# Patient Record
Sex: Male | Born: 1950 | Race: Black or African American | Hispanic: No | Marital: Single | Smoking: Former smoker
Health system: Southern US, Community
[De-identification: ages and names within clinical notes are randomized; demographics above are authoritative.]

## PROBLEM LIST (undated history)

## (undated) DIAGNOSIS — R0602 Shortness of breath: Secondary | ICD-10-CM

## (undated) DIAGNOSIS — E785 Hyperlipidemia, unspecified: Secondary | ICD-10-CM

## (undated) DIAGNOSIS — I10 Essential (primary) hypertension: Secondary | ICD-10-CM

## (undated) DIAGNOSIS — C649 Malignant neoplasm of unspecified kidney, except renal pelvis: Secondary | ICD-10-CM

## (undated) DIAGNOSIS — E039 Hypothyroidism, unspecified: Secondary | ICD-10-CM

## (undated) DIAGNOSIS — E860 Dehydration: Principal | ICD-10-CM

## (undated) DIAGNOSIS — I499 Cardiac arrhythmia, unspecified: Secondary | ICD-10-CM

## (undated) DIAGNOSIS — I509 Heart failure, unspecified: Secondary | ICD-10-CM

---

## 2004-03-03 ENCOUNTER — Encounter: Admission: RE | Admit: 2004-03-03 | Discharge: 2004-03-03 | Payer: Self-pay | Admitting: Cardiovascular Disease

## 2005-07-27 IMAGING — CR DG CHEST 2V
2 series · 2 of 2 positions shown · non-contrast
Comparison: none

CLINICAL DATA: Chest pain. 
 CHEST X-RAY: 
 Two views of the chest show the lungs to be clear.  There is mild cardiomegaly present.  No acute bony abnormality is seen.

[view not recorded (1 of 2)]
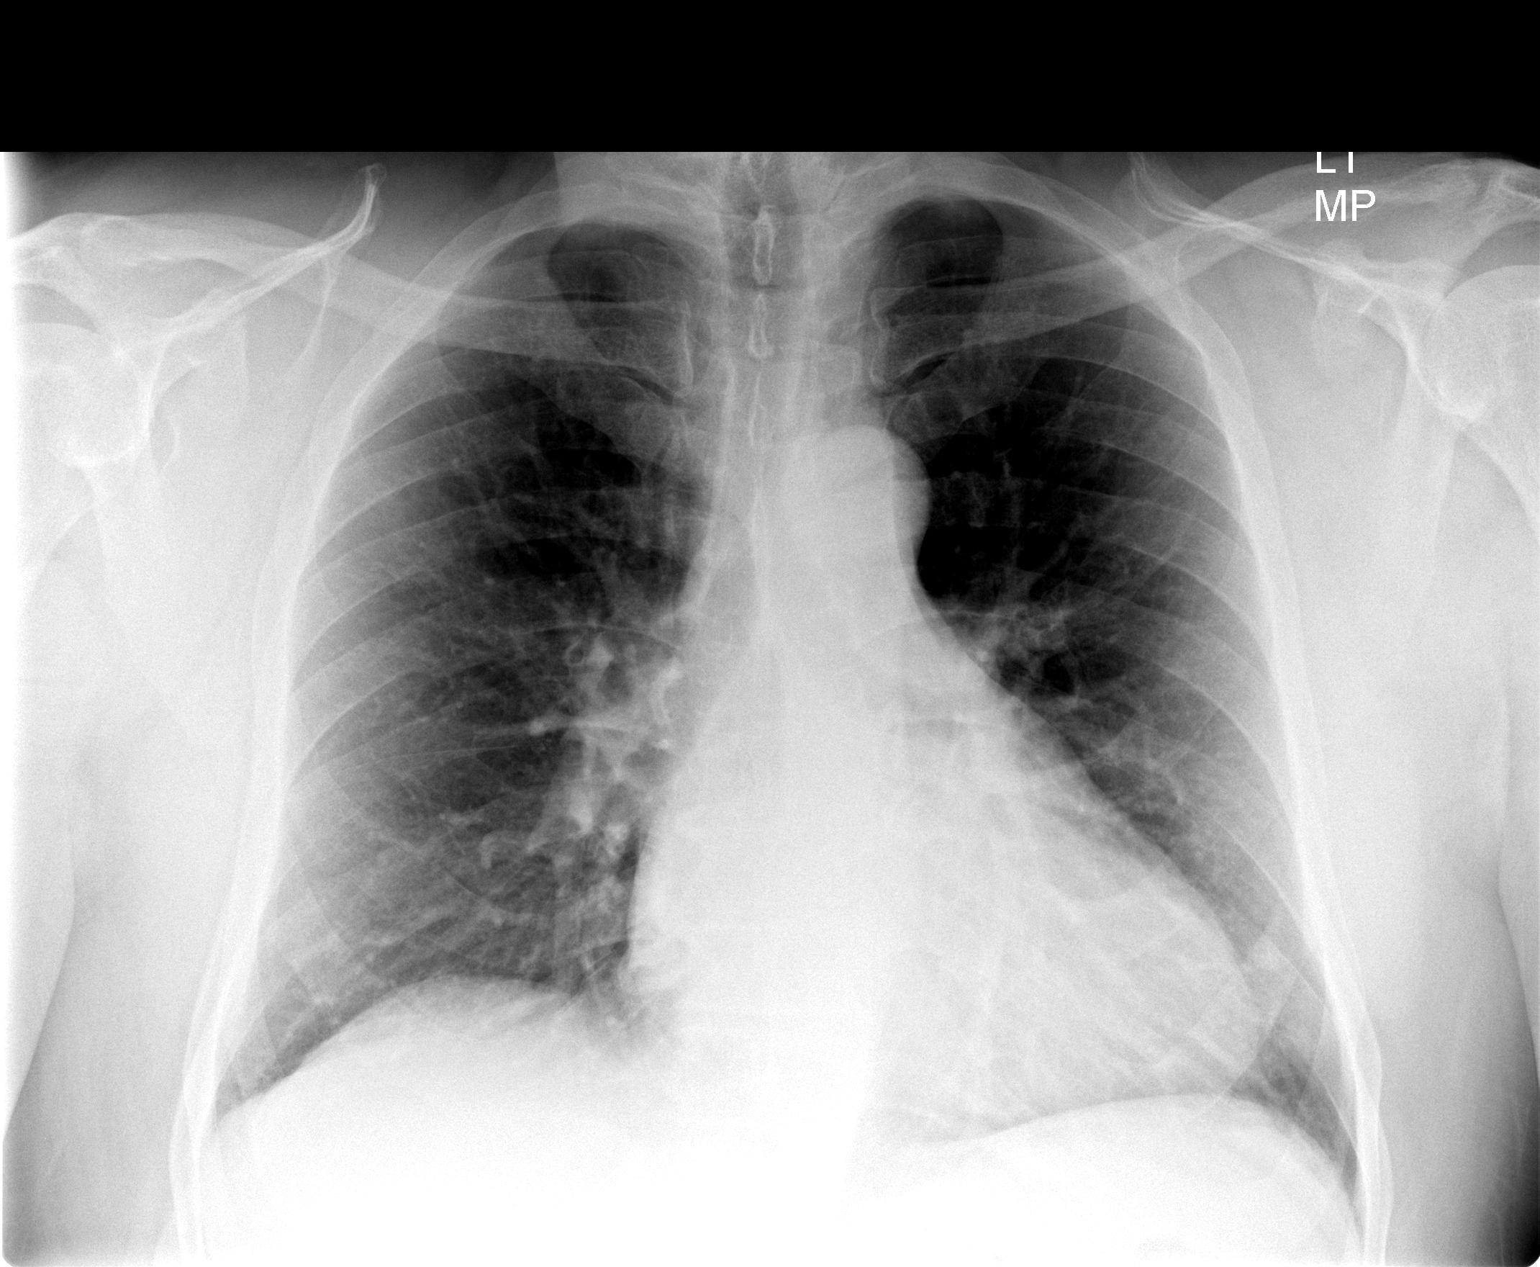

[view not recorded (2 of 2)]
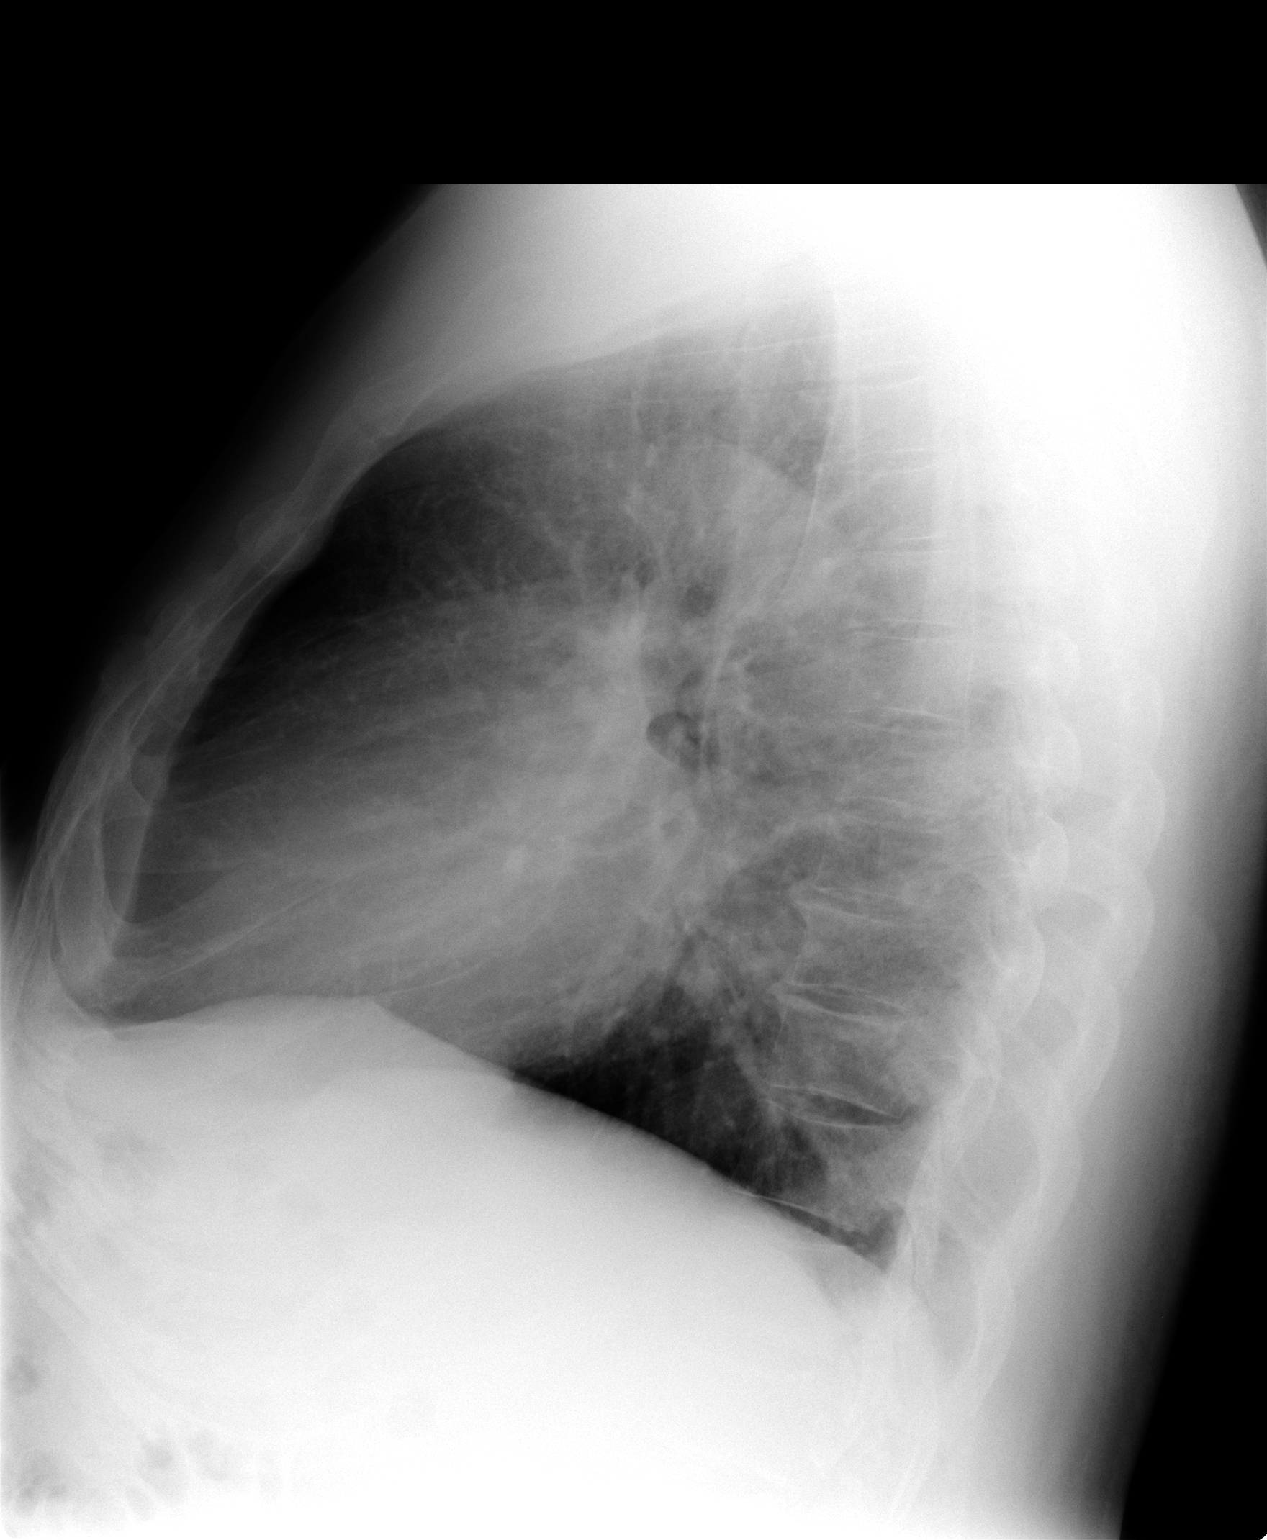

[2 of 2 positions shown; findings below may reference images not displayed]

IMPRESSION: No active lung disease.  Mild cardiomegaly.

## 2012-05-04 ENCOUNTER — Ambulatory Visit (HOSPITAL_BASED_OUTPATIENT_CLINIC_OR_DEPARTMENT_OTHER)
Admission: RE | Admit: 2012-05-04 | Discharge: 2012-05-04 | Disposition: A | Discharge status: Home / Self Care | Payer: Medicare Other | Source: Ambulatory Visit | Attending: Orthopedic Surgery | Admitting: Orthopedic Surgery

## 2012-05-04 ENCOUNTER — Encounter (HOSPITAL_BASED_OUTPATIENT_CLINIC_OR_DEPARTMENT_OTHER)
Admission: RE | Disposition: A | Discharge status: Home / Self Care | Payer: Self-pay | Source: Ambulatory Visit | Attending: Orthopedic Surgery

## 2012-05-04 ENCOUNTER — Encounter (HOSPITAL_BASED_OUTPATIENT_CLINIC_OR_DEPARTMENT_OTHER): Payer: Self-pay

## 2012-05-04 DIAGNOSIS — I10 Essential (primary) hypertension: Secondary | ICD-10-CM | POA: Insufficient documentation

## 2012-05-04 DIAGNOSIS — Z79899 Other long term (current) drug therapy: Secondary | ICD-10-CM | POA: Insufficient documentation

## 2012-05-04 DIAGNOSIS — Z91041 Radiographic dye allergy status: Secondary | ICD-10-CM | POA: Insufficient documentation

## 2012-05-04 DIAGNOSIS — L03019 Cellulitis of unspecified finger: Secondary | ICD-10-CM | POA: Insufficient documentation

## 2012-05-04 DIAGNOSIS — IMO0002 Reserved for concepts with insufficient information to code with codable children: Secondary | ICD-10-CM | POA: Insufficient documentation

## 2012-05-04 HISTORY — PX: IRRIGATION AND DEBRIDEMENT ABSCESS: SHX5252

## 2012-05-04 HISTORY — DX: Essential (primary) hypertension: I10

## 2012-05-04 HISTORY — DX: Hypothyroidism, unspecified: E03.9

## 2012-05-04 SURGERY — MINOR INCISION AND DRAINAGE OF ABSCESS
Anesthesia: LOCAL | Site: Finger | Laterality: Left | Wound class: Dirty or Infected

## 2012-05-04 MED ORDER — SULFAMETHOXAZOLE-TRIMETHOPRIM 800-160 MG PO TABS: 1.0000 | ORAL_TABLET | Freq: Two times a day (BID) | ORAL | Status: DC

## 2012-05-04 MED ORDER — OXYCODONE-ACETAMINOPHEN 5-325 MG PO TABS: ORAL_TABLET | ORAL | Status: AC

## 2012-05-04 MED ORDER — LIDOCAINE HCL 2 % IJ SOLN
INTRAMUSCULAR | Status: DC | PRN
  Administered 2012-05-04: 7 mL

## 2012-05-04 SURGICAL SUPPLY — 38 items
BANDAGE ADHESIVE 1X3 (GAUZE/BANDAGES/DRESSINGS) IMPLANT
BLADE SURG 15 STRL LF DISP TIS (BLADE) ×1 IMPLANT
BLADE SURG 15 STRL SS (BLADE) ×1
BNDG COHESIVE 1X5 TAN STRL LF (GAUZE/BANDAGES/DRESSINGS) IMPLANT
BNDG ELASTIC 2 VLCR STRL LF (GAUZE/BANDAGES/DRESSINGS) IMPLANT
BNDG ESMARK 4X9 LF (GAUZE/BANDAGES/DRESSINGS) IMPLANT
BRUSH SCRUB EZ PLAIN DRY (MISCELLANEOUS) ×2 IMPLANT
CLOTH BEACON ORANGE TIMEOUT ST (SAFETY) ×2 IMPLANT
CORDS BIPOLAR (ELECTRODE) IMPLANT
COVER MAYO STAND STRL (DRAPES) ×2 IMPLANT
CUFF TOURNIQUET SINGLE 18IN (TOURNIQUET CUFF) IMPLANT
DECANTER SPIKE VIAL GLASS SM (MISCELLANEOUS) IMPLANT
DRAIN PENROSE 1/2X12 LTX STRL (WOUND CARE) ×2 IMPLANT
DRAPE SURG 17X23 STRL (DRAPES) ×2 IMPLANT
DRSG EMULSION OIL 3X3 NADH (GAUZE/BANDAGES/DRESSINGS) ×2 IMPLANT
GAUZE SPONGE 4X4 12PLY STRL LF (GAUZE/BANDAGES/DRESSINGS) ×4 IMPLANT
GAUZE XEROFORM 1X8 LF (GAUZE/BANDAGES/DRESSINGS) IMPLANT
GLOVE BIOGEL M STRL SZ7.5 (GLOVE) ×2 IMPLANT
GLOVE ORTHO TXT STRL SZ7.5 (GLOVE) ×2 IMPLANT
GOWN BRE IMP PREV XXLGXLNG (GOWN DISPOSABLE) ×4 IMPLANT
GOWN PREVENTION PLUS XLARGE (GOWN DISPOSABLE) ×2 IMPLANT
NEEDLE 27GAX1X1/2 (NEEDLE) ×2 IMPLANT
PACK BASIN DAY SURGERY FS (CUSTOM PROCEDURE TRAY) ×2 IMPLANT
PADDING CAST ABS 4INX4YD NS (CAST SUPPLIES) ×1
PADDING CAST ABS COTTON 4X4 ST (CAST SUPPLIES) ×1 IMPLANT
SPONGE GAUZE 4X4 12PLY (GAUZE/BANDAGES/DRESSINGS) ×2 IMPLANT
STOCKINETTE 4X48 STRL (DRAPES) ×2 IMPLANT
STRIP CLOSURE SKIN 1/2X4 (GAUZE/BANDAGES/DRESSINGS) IMPLANT
SUT ETHILON 5 0 P 3 18 (SUTURE) ×1
SUT NYLON ETHILON 5-0 P-3 1X18 (SUTURE) ×1 IMPLANT
SUT PROLENE 4 0 P 3 18 (SUTURE) IMPLANT
SWAB COLLECTION DEVICE MRSA (MISCELLANEOUS) ×2 IMPLANT
SYR 3ML 23GX1 SAFETY (SYRINGE) IMPLANT
SYR CONTROL 10ML LL (SYRINGE) ×2 IMPLANT
TOWEL OR 17X24 6PK STRL BLUE (TOWEL DISPOSABLE) ×2 IMPLANT
TRAY DSU PREP LF (CUSTOM PROCEDURE TRAY) ×2 IMPLANT
TUBE ANAEROBIC SPECIMEN COL (MISCELLANEOUS) ×2 IMPLANT
UNDERPAD 30X30 INCONTINENT (UNDERPADS AND DIAPERS) ×2 IMPLANT

## 2012-05-04 NOTE — Op Note (Signed)
[id number] 

## 2012-05-04 NOTE — Brief Op Note (Signed)
05/04/2012  2:22 PM  PATIENT:  Shawn Cole  62 y.o. male  PRE-OPERATIVE DIAGNOSIS:  LEFT  Middle Finger  INFECTION  POST-OPERATIVE DIAGNOSIS:  LEFT  Middle Finger  INFECTION  PROCEDURE:  Procedure(s): MINOR INCISION AND DRAINAGE OF ABSCESS (Left)  SURGEON:  Surgeon(s) and Role:    * Wyn Forster., MD - Primary  PHYSICIAN ASSISTANT:   ASSISTANTS: surgical technician   ANESTHESIA:   local  EBL:     BLOOD ADMINISTERED:none  DRAINS: none   LOCAL MEDICATIONS USED:  XYLOCAINE   SPECIMEN:  No Specimen  DISPOSITION OF SPECIMEN:  N/A  COUNTS:  YES  TOURNIQUET:  * No tourniquets in log *  DICTATION: .Other Dictation: Dictation Number [id number]  PLAN OF CARE: Discharge to home after PACU  PATIENT DISPOSITION:  PACU - hemodynamically stable.   Delay start of Pharmacological VTE agent (>24hrs) due to surgical blood loss or risk of bleeding: not applicable

## 2012-05-04 NOTE — H&P (Signed)
Shawn Cole is an 62 y.o. male.   Chief Complaint: infected left long finger with visible pus at nail fold and severe pain HPI:62 year old male sent by Dr Algie Coffer for management of a subacute infection of his left long finger present several days.  No Hx of trauma.  No past medical history on file.  No past surgical history on file.  No family history on file. Social History:  has no tobacco, alcohol, and drug history on file.  Allergies: Allergies not on file  No prescriptions prior to admission    No results found for this or any previous visit (from the past 48 hour(s)). No results found.  Review of Systems  Constitutional: Negative.   HENT: Negative.   Eyes: Negative.   Respiratory: Negative.   Cardiovascular: Negative.   Gastrointestinal: Negative.   Genitourinary: Negative.   Musculoskeletal:       Stage 3 paronychia of left long finger, tense pulp with throbbing pain  Skin: Negative.   Neurological: Negative.   Endo/Heme/Allergies: Negative.   Psychiatric/Behavioral: Negative.     There were no vitals taken for this visit. Physical Exam  Constitutional: He is oriented to person, place, and time. He appears well-developed and well-nourished.  HENT:  Head: Normocephalic and atraumatic.  Eyes: Conjunctivae and EOM are normal. Pupils are equal, round, and reactive to light.  Neck: Normal range of motion. Neck supple.  Cardiovascular: Normal rate, regular rhythm and normal heart sounds.   Respiratory: Effort normal and breath sounds normal.  GI: Soft. Bowel sounds are normal.  Musculoskeletal: Normal range of motion.  Neurological: He is alert and oriented to person, place, and time. He has normal reflexes.  Skin: Skin is warm and dry.  Stage 3 paronychia of left long finger     Assessment/Plan Paronychia and felon of left long finger  Incision and drainage of left long finger Digital block planned in minor room setting  Procedure detailed, aftercare  outlined, questions invited and answered in detail  Rejina Odle JR,Akashdeep Chuba V 05/04/2012, 1:21 PM

## 2012-05-05 ENCOUNTER — Encounter (HOSPITAL_BASED_OUTPATIENT_CLINIC_OR_DEPARTMENT_OTHER): Payer: Self-pay | Admitting: Orthopedic Surgery

## 2012-05-05 NOTE — Op Note (Signed)
NAMEARGYLE, Shawn Cole                ACCOUNT NO.:  [id number]  MEDICAL RECORD NO.:  [id number]  LOCATION:                                 FACILITY:  PHYSICIAN:  Shawn Fitch. Bland Rudzinski, M.D. DATE OF BIRTH:  1950-04-10  DATE OF PROCEDURE:  05/04/2012 DATE OF DISCHARGE:                              OPERATIVE REPORT   PREOPERATIVE DIAGNOSIS:  Stage III paronychia infection of left long finger and early felon.  POSTOPERATIVE DIAGNOSIS:  Stage III paronychia infection of left long finger and early felon.  OPERATION: 1. Incision and drainage of dorsal nail fold, left long finger. 2. Incision and drainage of pulp through radial and ulnar nail fold     incisions.  OPERATING SURGEON:  Shawn Fitch. Refugia Laneve, M.D.  ASSISTANT:  Surgical technician.  ANESTHESIA:  2% lidocaine metacarpal head level block of left long finger.  This was performed as a minor operating room procedure.  ANESTHETIST:  Shawn Fitch. Sueellen Kayes, M.D.  INDICATIONS:  Shawn Cole is a 62 year old gentleman referred through the courtesy of Dr. Algie Cole for acute treatment of a severe stage III paronychia of the left long finger and a tense throbbing pulp of the left long finger.  Dr. Algie Cole was seeing him in the office for followup of his high blood pressure and other medical issues, and noted the stage III paronychia.  Shawn Cole noted that this had been coming on slowly over 4 days.  He could not recall specific trauma.  His past history was reviewed in detail.  He reported that he was allergic to iodinated contrast dye.  He did not have other medication allergies.  He could not completely recall the list of his medicines, but reported that he was on medication for high blood pressure.  He did report that he had no antibiotic allergies.  Clinical examination acutely at [facility] revealed a stage III paronychia and an early felon with a tense pulp that was throbbing and very painful.  There was increased turgor noted  of the left long finger pulp compared to the adjacent digits.  We advised immediate incision and drainage under local anesthesia, i.e. metacarpal head level block.  After detailed informed consent, Shawn Cole is brought to the operating room at this time.  PROCEDURE:  Shawn Cole was brought to room 1 of the [facility] and placed in a supine position upon the operating table.  Following anesthesia, informed consent, and Betadine prep, 4 mL of 2% lidocaine was infiltrated at the metacarpal head level to obtain a digital block.  After 10 minutes, excellent anesthesia was achieved.  The left hand and arm were prepped with Betadine soap and solution, sterilely draped.  The left long finger was elevated, compressed, and a 0.5-inch Penrose drain was placed with the proximal phalangeal segment as the digital tourniquet.  Following routine surgical time-out, procedure commenced with elevation of the nail plate and recovery of foul-purulent material.  This was cultured for aerobic and anaerobic growth.  Necrotic tissue around the nail fold was debrided with scissor dissection followed by evacuation of all purulent material and use of a curette along the nail fold and nail wall.  The  pulp was quite tense even after decompression of the abscess cavity, therefore tenotomy scissors were used on the radial and ulnar aspect of the diaphysis of the distal phalanx to enter the pulp deep to the neurovascular structures.  The pulp was decompressed by release of the septae.  The digit was then dressed with a small portion of the nail deep to the nail fold to prevent adhesion and a split nail deformity.  The nail had been soaked in Betadine and cleaned of all soft tissue prior to replacement.  The finger was then dressed with Adaptic, sterile gauze, and a finger dressing of Coban.  The tourniquet was released with immediate capillary refill to the finger.  Shawn Cole tolerated this  procedure well.  For aftercare, he was placed on Septra DS 1 p.o. b.i.d. x7 days as a therapeutic antibiotic.  He was also provided Percocet 5 mg 1 p.o. q.4-6 hours p.r.n. pain, 30 tablets without refill.     Shawn Cole, M.D.     RVS/MEDQ  D:  05/04/2012  T:  05/05/2012  Job:  [id number]

## 2012-05-07 LAB — CULTURE, ROUTINE-ABSCESS

## 2012-05-09 LAB — ANAEROBIC CULTURE

## 2012-06-02 LAB — FUNGUS CULTURE W SMEAR: Fungal Smear: NONE SEEN

## 2012-06-16 LAB — AFB CULTURE WITH SMEAR (NOT AT ARMC)

## 2012-11-22 ENCOUNTER — Encounter (HOSPITAL_COMMUNITY): Payer: Self-pay | Admitting: General Practice

## 2012-11-22 ENCOUNTER — Inpatient Hospital Stay (HOSPITAL_COMMUNITY): Payer: Medicare Other

## 2012-11-22 ENCOUNTER — Inpatient Hospital Stay (HOSPITAL_COMMUNITY)
Admission: AD | Admit: 2012-11-22 | Discharge: 2012-11-26 | DRG: 641 | Disposition: A | Discharge status: Home / Self Care | Payer: Medicare Other | Source: Ambulatory Visit | Attending: Cardiovascular Disease | Admitting: Cardiovascular Disease

## 2012-11-22 DIAGNOSIS — E039 Hypothyroidism, unspecified: Secondary | ICD-10-CM | POA: Diagnosis present

## 2012-11-22 DIAGNOSIS — C7800 Secondary malignant neoplasm of unspecified lung: Secondary | ICD-10-CM | POA: Diagnosis present

## 2012-11-22 DIAGNOSIS — Z7982 Long term (current) use of aspirin: Secondary | ICD-10-CM

## 2012-11-22 DIAGNOSIS — I4891 Unspecified atrial fibrillation: Secondary | ICD-10-CM | POA: Diagnosis present

## 2012-11-22 DIAGNOSIS — E785 Hyperlipidemia, unspecified: Secondary | ICD-10-CM | POA: Diagnosis present

## 2012-11-22 DIAGNOSIS — D649 Anemia, unspecified: Secondary | ICD-10-CM | POA: Diagnosis present

## 2012-11-22 DIAGNOSIS — C649 Malignant neoplasm of unspecified kidney, except renal pelvis: Secondary | ICD-10-CM | POA: Diagnosis present

## 2012-11-22 DIAGNOSIS — R918 Other nonspecific abnormal finding of lung field: Secondary | ICD-10-CM

## 2012-11-22 DIAGNOSIS — I10 Essential (primary) hypertension: Secondary | ICD-10-CM | POA: Diagnosis present

## 2012-11-22 DIAGNOSIS — E669 Obesity, unspecified: Secondary | ICD-10-CM | POA: Diagnosis present

## 2012-11-22 DIAGNOSIS — N2889 Other specified disorders of kidney and ureter: Secondary | ICD-10-CM

## 2012-11-22 DIAGNOSIS — Z6831 Body mass index (BMI) 31.0-31.9, adult: Secondary | ICD-10-CM

## 2012-11-22 DIAGNOSIS — E860 Dehydration: Principal | ICD-10-CM | POA: Diagnosis present

## 2012-11-22 DIAGNOSIS — Z87891 Personal history of nicotine dependence: Secondary | ICD-10-CM

## 2012-11-22 DIAGNOSIS — R634 Abnormal weight loss: Secondary | ICD-10-CM | POA: Diagnosis present

## 2012-11-22 DIAGNOSIS — J069 Acute upper respiratory infection, unspecified: Secondary | ICD-10-CM | POA: Diagnosis present

## 2012-11-22 DIAGNOSIS — R630 Anorexia: Secondary | ICD-10-CM | POA: Diagnosis present

## 2012-11-22 HISTORY — DX: Hyperlipidemia, unspecified: E78.5

## 2012-11-22 HISTORY — DX: Dehydration: E86.0

## 2012-11-22 LAB — CBC WITH DIFFERENTIAL/PLATELET
Basophils Absolute: 0 10*3/uL (ref 0.0–0.1)
Eosinophils Absolute: 0.1 10*3/uL (ref 0.0–0.7)
Lymphocytes Relative: 33 % (ref 12–46)
Lymphs Abs: 1.8 10*3/uL (ref 0.7–4.0)
MCH: 19.6 pg — ABNORMAL LOW (ref 26.0–34.0)
MCHC: 29.1 g/dL — ABNORMAL LOW (ref 30.0–36.0)
MCV: 67.2 fL — ABNORMAL LOW (ref 78.0–100.0)
Monocytes Absolute: 0.5 10*3/uL (ref 0.1–1.0)
Platelets: 253 10*3/uL (ref 150–400)
RDW: 16.9 % — ABNORMAL HIGH (ref 11.5–15.5)

## 2012-11-22 LAB — COMPREHENSIVE METABOLIC PANEL
AST: 10 U/L (ref 0–37)
Albumin: 3 g/dL — ABNORMAL LOW (ref 3.5–5.2)
Alkaline Phosphatase: 64 U/L (ref 39–117)
CO2: 26 mEq/L (ref 19–32)
Chloride: 91 mEq/L — ABNORMAL LOW (ref 96–112)
Potassium: 3.6 mEq/L (ref 3.5–5.1)
Sodium: 134 mEq/L — ABNORMAL LOW (ref 135–145)
Total Bilirubin: 0.6 mg/dL (ref 0.3–1.2)

## 2012-11-22 MED ORDER — ACETAMINOPHEN 650 MG RE SUPP: 650.0000 mg | Freq: Four times a day (QID) | RECTAL | Status: DC | PRN

## 2012-11-22 MED ORDER — ONDANSETRON HCL 4 MG PO TABS: 4.0000 mg | ORAL_TABLET | Freq: Four times a day (QID) | ORAL | Status: DC | PRN

## 2012-11-22 MED ORDER — ACETAMINOPHEN 325 MG PO TABS: 650.0000 mg | ORAL_TABLET | Freq: Four times a day (QID) | ORAL | Status: DC | PRN

## 2012-11-22 MED ORDER — KCL IN DEXTROSE-NACL 20-5-0.45 MEQ/L-%-% IV SOLN
INTRAVENOUS | Status: DC
  Administered 2012-11-22 – 2012-11-23 (×2): via INTRAVENOUS
  Filled 2012-11-22 (×4): qty 1000

## 2012-11-22 MED ORDER — DOCUSATE SODIUM 100 MG PO CAPS
100.0000 mg | ORAL_CAPSULE | Freq: Two times a day (BID) | ORAL | Status: DC
  Administered 2012-11-22 – 2012-11-26 (×8): 100 mg via ORAL
  Filled 2012-11-22 (×11): qty 1

## 2012-11-22 MED ORDER — MAGNESIUM CITRATE PO SOLN
1.0000 | Freq: Once | ORAL | Status: AC | PRN
  Filled 2012-11-22: qty 296

## 2012-11-22 MED ORDER — HEPARIN SODIUM (PORCINE) 5000 UNIT/ML IJ SOLN
5000.0000 [IU] | Freq: Three times a day (TID) | INTRAMUSCULAR | Status: DC
  Administered 2012-11-22 – 2012-11-24 (×6): 5000 [IU] via SUBCUTANEOUS
  Filled 2012-11-22 (×9): qty 1

## 2012-11-22 MED ORDER — ONDANSETRON HCL 4 MG/2ML IJ SOLN: 4.0000 mg | Freq: Four times a day (QID) | INTRAMUSCULAR | Status: DC | PRN

## 2012-11-22 MED ORDER — SODIUM CHLORIDE 0.9 % IJ SOLN
3.0000 mL | Freq: Two times a day (BID) | INTRAMUSCULAR | Status: DC
  Administered 2012-11-22 – 2012-11-25 (×7): 3 mL via INTRAVENOUS

## 2012-11-22 NOTE — Progress Notes (Signed)
Patient admitted to unit directly from MD's office.  MD paged upon patient's arrival.  Patient A&Ox4, nephew at bedside, phone and call bell within reach.  Will continue to monitor.

## 2012-11-22 NOTE — H&P (Signed)
Shawn Cole is an 62 y.o. male.   Chief Complaint: Abdominal pain and loss of weight. HPI: 62 year old male has few weeks of poor oral intake, weight loss and abdominal discomfort. He appeared dry with low blood pressure in office. No vomiting or fever.  Past Medical History  Diagnosis Date  . Hypertension   . Hypothyroidism   . Hyperlipidemia   . Dehydration       Past Surgical History  Procedure Laterality Date  . Irrigation and debridement abscess Left 05/04/2012    Procedure: MINOR INCISION AND DRAINAGE OF ABSCESS;  Surgeon: Shawn Cole., MD;  Location: [facility];  Service: Orthopedics;  Laterality: Left;    History reviewed. No pertinent family history. Social History:  reports that he quit smoking about 15 years ago. He has never used smokeless tobacco. He reports that he does not drink alcohol or use illicit drugs.  Allergies:  Allergies  Allergen Reactions  . Contrast Media [Iodinated Diagnostic Agents] Other (See Comments)    "couldn"t use hands or walk, sob"    Medications Prior to Admission  Medication Sig Dispense Refill  . amLODipine (NORVASC) 10 MG tablet Take 10 mg by mouth daily.      Marland Kitchen aspirin 81 MG tablet Take 81 mg by mouth daily.      Marland Kitchen atorvastatin (LIPITOR) 10 MG tablet Take 10 mg by mouth daily.      Marland Kitchen levothyroxine (SYNTHROID, LEVOTHROID) 25 MCG tablet Take 25 mcg by mouth daily before breakfast.      . metoprolol (LOPRESSOR) 100 MG tablet Take 100 mg by mouth 1 day or 1 dose.      . oxyCODONE-acetaminophen (PERCOCET/ROXICET) 5-325 MG per tablet One or two tablets every 4 to 6 hours as needed for pain  30 tablet  0    Results for orders placed during the hospital encounter of 11/22/12 (from the past 48 hour(s))  COMPREHENSIVE METABOLIC PANEL     Status: Abnormal   Collection Time    11/22/12  2:30 PM      Result Value Range   Sodium 134 (*) 135 - 145 mEq/L   Potassium 3.6  3.5 - 5.1 mEq/L   Chloride 91 (*) 96 - 112 mEq/L   CO2 26  19 - 32 mEq/L   Glucose, Bld 88  70 - 99 mg/dL   BUN 8  6 - 23 mg/dL   Creatinine, Ser 4.09  0.50 - 1.35 mg/dL   Calcium 81.1  8.4 - 91.4 mg/dL   Total Protein 9.0 (*) 6.0 - 8.3 g/dL   Albumin 3.0 (*) 3.5 - 5.2 g/dL   AST 10  0 - 37 U/L   ALT 6  0 - 53 U/L   Alkaline Phosphatase 64  39 - 117 U/L   Total Bilirubin 0.6  0.3 - 1.2 mg/dL   GFR calc non Af Amer >90  >90 mL/min   GFR calc Af Amer >90  >90 mL/min   Comment: (NOTE)     The eGFR has been calculated using the CKD EPI equation.     This calculation has not been validated in all clinical situations.     eGFR's persistently <90 mL/min signify possible Chronic Kidney     Disease.  CBC WITH DIFFERENTIAL     Status: Abnormal   Collection Time    11/22/12  2:30 PM      Result Value Range   WBC 5.3  4.0 - 10.5 K/uL  RBC 5.21  4.22 - 5.81 MIL/uL   Hemoglobin 10.2 (*) 13.0 - 17.0 g/dL   HCT 16.1 (*) 09.6 - 04.5 %   MCV 67.2 (*) 78.0 - 100.0 fL   MCH 19.6 (*) 26.0 - 34.0 pg   MCHC 29.1 (*) 30.0 - 36.0 g/dL   RDW 40.9 (*) 81.1 - 91.4 %   Platelets 253  150 - 400 K/uL   Neutrophils Relative % 56  43 - 77 %   Lymphocytes Relative 33  12 - 46 %   Monocytes Relative 10  3 - 12 %   Eosinophils Relative 1  0 - 5 %   Basophils Relative 0  0 - 1 %   Neutro Abs 2.9  1.7 - 7.7 K/uL   Lymphs Abs 1.8  0.7 - 4.0 K/uL   Monocytes Absolute 0.5  0.1 - 1.0 K/uL   Eosinophils Absolute 0.1  0.0 - 0.7 K/uL   Basophils Absolute 0.0  0.0 - 0.1 K/uL   Smear Review MORPHOLOGY UNREMARKABLE     No results found.  ROS  Constitutional: Negative.  HENT: Negative.  Eyes: Negative.  Respiratory: Negative.  Cardiovascular: H/O atrial fibrillation.  Gastrointestinal: Negative.  Genitourinary: Negative.  Skin: Negative.  Neurological: Negative.  Endo/Heme/Allergies: H/O hypothyroidism, obesity.  Psychiatric/Behavioral: Negative.   Blood pressure 103/67, pulse 86, temperature 98.9 F (37.2 C), temperature source Oral, resp. rate 20,  height 5\' 9"  (1.753 m), weight 94.3 kg (207 lb 14.3 oz), SpO2 99.00%.  Physical Exam  Constitutional: He is oriented to person, place, and time. He appears well-developed and well-nourished.  HENT: Normocephalic and atraumatic. Eyes: Shawn Cole, Conjunctivae and EOM are normal. Pupils are equal, round, and reactive to light. Sclera-white. Neck: Normal range of motion. Neck supple.  Cardiovascular: Normal rate, regular rhythm and normal heart sounds.  Respiratory: Effort normal and breath sounds normal.  GI: Soft. Bowel sounds are normal.  Musculoskeletal: Normal range of motion.  Neurological: He is alert and oriented to person, place, and time. He has normal reflexes.  Skin: Skin is warm and dry.   Assessment/Plan Dehydration Hypothyroidism Obesity Abdominal pain Weight loss with loss of appetite  Admit/IV fluids/CT abdomen and pelvis/Check lipase.  Shawn Cole S 11/22/2012, 6:34 PM

## 2012-11-22 NOTE — Progress Notes (Signed)
Pt o4x, bed setting on edge of bed eating on complaints of SOB or Cp. Abdomen tender on palpitation. IV fluids started orthostats done. Will continue to monitor.

## 2012-11-23 ENCOUNTER — Encounter (HOSPITAL_COMMUNITY): Payer: Self-pay | Admitting: Radiology

## 2012-11-23 ENCOUNTER — Inpatient Hospital Stay (HOSPITAL_COMMUNITY): Payer: Medicare Other

## 2012-11-23 LAB — BASIC METABOLIC PANEL
BUN: 7 mg/dL (ref 6–23)
Calcium: 9.8 mg/dL (ref 8.4–10.5)
Chloride: 90 mEq/L — ABNORMAL LOW (ref 96–112)
Creatinine, Ser: 0.73 mg/dL (ref 0.50–1.35)
GFR calc Af Amer: >90 mL/min (ref >90)
GFR calc non Af Amer: >90 mL/min (ref >90)
Sodium: 130 mEq/L — ABNORMAL LOW (ref 135–145)

## 2012-11-23 LAB — CBC
HCT: 33.8 % — ABNORMAL LOW (ref 39.0–52.0)
MCHC: 30.2 g/dL (ref 30.0–36.0)
RBC: 5.03 MIL/uL (ref 4.22–5.81)
RDW: 16.8 % — ABNORMAL HIGH (ref 11.5–15.5)

## 2012-11-23 LAB — IRON AND TIBC: TIBC: 202 ug/dL — ABNORMAL LOW (ref 215–435)

## 2012-11-23 LAB — LIPASE, BLOOD: Lipase: 10 U/L — ABNORMAL LOW (ref 11–59)

## 2012-11-23 MED ORDER — ENSURE COMPLETE PO LIQD
237.0000 mL | ORAL | Status: DC
  Administered 2012-11-23 – 2012-11-26 (×3): 237 mL via ORAL

## 2012-11-23 MED ORDER — DEXTROSE 5 % IV SOLN
500.0000 mg | INTRAVENOUS | Status: DC
  Administered 2012-11-23 – 2012-11-25 (×3): 500 mg via INTRAVENOUS
  Filled 2012-11-23 (×6): qty 500

## 2012-11-23 MED ORDER — SODIUM CHLORIDE 0.9 % IV SOLN
INTRAVENOUS | Status: DC
  Administered 2012-11-23: 10:00:00 via INTRAVENOUS

## 2012-11-23 NOTE — Progress Notes (Signed)
  Echocardiogram 2D Echocardiogram has been performed.  Cathie Beams 11/23/2012, 11:48 AM

## 2012-11-23 NOTE — Progress Notes (Signed)
Subjective:  + cough with white sputum. No chest pain or fever.   Objective:  Vital Signs in the last 24 hours: Temp:  [97.6 F (36.4 C)-98.9 F (37.2 C)] 98.2 F (36.8 C) (11/20 0545) Pulse Rate:  [77-104] 77 (11/20 0545) Cardiac Rhythm:  [-] Atrial fibrillation (11/19 2045) Resp:  [18-21] 18 (11/20 0545) BP: (101-131)/(60-77) 109/68 mmHg (11/20 0545) SpO2:  [97 %-99 %] 97 % (11/20 0545) Weight:  [94.3 kg (207 lb 14.3 oz)-96.2 kg (212 lb 1.3 oz)] 96.2 kg (212 lb 1.3 oz) (11/20 0545)  Physical Exam: BP Readings from Last 1 Encounters:  11/23/12 109/68     Wt Readings from Last 1 Encounters:  11/23/12 96.2 kg (212 lb 1.3 oz)    Weight change:   HEENT: [place]/AT, Eyes-Brown, PERL, EOMI, Conjunctiva-Pink, Sclera-Non-icteric Neck: No JVD, No bruit, Trachea midline. Lungs:  Clear, Bilateral. Cardiac:  Regular rhythm, normal S1 and S2, no S3.  Abdomen:  Soft, non-tender. Extremities:  No edema present. No cyanosis. No clubbing. CNS: AxOx3, Cranial nerves grossly intact, moves all 4 extremities. Right handed. Skin: Warm and dry.   Intake/Output from previous day: 11/19 0701 - 11/20 0700 In: 1600 [P.O.:1600] Out: 525 [Urine:525]    Lab Results: BMET    Component Value Date/Time   NA 130* 11/23/2012 0600   K 3.6 11/23/2012 0600   CL 90* 11/23/2012 0600   CO2 27 11/23/2012 0600   GLUCOSE 102* 11/23/2012 0600   BUN 7 11/23/2012 0600   CREATININE 0.73 11/23/2012 0600   CALCIUM 9.8 11/23/2012 0600   GFRNONAA >90 11/23/2012 0600   GFRAA >90 11/23/2012 0600   CBC    Component Value Date/Time   WBC 5.2 11/23/2012 0600   RBC 5.03 11/23/2012 0600   HGB 10.2* 11/23/2012 0600   HCT 33.8* 11/23/2012 0600   PLT 243 11/23/2012 0600   MCV 67.2* 11/23/2012 0600   MCH 20.3* 11/23/2012 0600   MCHC 30.2 11/23/2012 0600   RDW 16.8* 11/23/2012 0600   LYMPHSABS 1.8 11/22/2012 1430   MONOABS 0.5 11/22/2012 1430   EOSABS 0.1 11/22/2012 1430   BASOSABS 0.0 11/22/2012 1430   CARDIAC  ENZYMES No results found for this basename: CKTOTAL, CKMB, CKMBINDEX, TROPONINI    Scheduled Meds: . docusate sodium  100 mg Oral BID  . heparin  5,000 Units Subcutaneous Q8H  . sodium chloride  3 mL Intravenous Q12H   Continuous Infusions: . sodium chloride     PRN Meds:.acetaminophen, acetaminophen, ondansetron (ZOFRAN) IV, ondansetron  CT abdomen and pelvis: At least 12.7 x 11.3 x 11.5 cm left upper pole renal mass, difficult to further characterize on this noncontrast examination, highly concerning for primary renal neoplasm.  5.2 cm left adrenal mass, 11 mm right adrenal mass in addition to multiple pulmonary nodules /masses, largest in the right middle lobe measuring 3 x 4.2 cm, highly concerning for metastatic disease. Patient is osteopenic with lucencies in the sacrum may reflect focal osteopenia, less likely lytic metastasis, sclerotic 7 mm left iliac bone lesion. PET-CT could be performed to evaluate for extent of disease.   Assessment/Plan: Dehydration-improving  Hypothyroidism  Obesity  Abdominal pain  Weight loss with loss of appetite Hyponatremia Anemia-r/o iron deficiency Left renal mass Pulmonary nodules  Oncology consult Decrease IV fluid and change to NS at 30 cc/hr.   LOS: 1 day    Shawn Cobb  MD  11/23/2012, 9:11 AM

## 2012-11-23 NOTE — Progress Notes (Signed)
INITIAL NUTRITION ASSESSMENT  DOCUMENTATION CODES Per approved criteria  -Obesity Unspecified   INTERVENTION: Add Ensure Complete po daily, each supplement provides 350 kcal and 13 grams of protein. RD to continue to follow nutrition care plan.  NUTRITION DIAGNOSIS: Inadequate oral intake related to variable appetite as evidenced by pt report and recent weight loss.   Goal: Intake to meet >90% of estimated nutrition needs.  Monitor:  weight trends, lab trends, I/O's, PO intake, supplement tolerance  Reason for Assessment: Malnutrition Screening Tool  62 y.o. male  Admitting Dx: L renal mass and pulmonary nodules  ASSESSMENT: PMHx significant for HTN, HLD, hypothyroidism. Admitted with abdominal pain, weight loss and poor oral intake x a few weeks. Work-up reveals L renal mass and pulmonary nodules. MD has consulted oncology.  Pt reports that he thinks that his usual weight is 300 lb and that he might have weighed this about 6 months ago. He said for the past 6 months he has had increased sputum, no matter what he eats or does, he continually needs to spit. Pt spitting several times during our discussion. He states that he hasn't been eating as much as he usually does but he is unable to give me specific examples or more details other than that he eats a sausage biscuit for breakfast and a fish plate for dinner.  He asks me if I know if he is dying and if he should go to hospice. I spoke with RN after discussion with patient to determine appropriateness of patient's history.   Pt appears well nourished, with moderate wasting only visible in his temporal region.  Pt is at nutrition risk given recent weight loss and poor appetite. Unable to dx with malnutrition as unsure accuracy of patient's information that he is providing.  Pt is agreeable to drinking Ensure daily. Eating 100% of meals presently.  Height: Ht Readings from Last 1 Encounters:  11/22/12 5\' 9"  (1.753 m)     Weight: Wt Readings from Last 1 Encounters:  11/23/12 212 lb 1.3 oz (96.2 kg)    Ideal Body Weight: 160 lb  % Ideal Body Weight: 132%  Wt Readings from Last 10 Encounters:  11/23/12 212 lb 1.3 oz (96.2 kg)    Usual Body Weight: 300 lb (per pt)  % Usual Body Weight: 71%  BMI:  Body mass index is 31.3 kg/(m^2). Obese Class I  Estimated Nutritional Needs: Kcal: 2000 - 2200 Protein: 85 - 100 g Fluid: 2 - 2.2 liters  Skin: intact  Diet Order: Cardiac  EDUCATION NEEDS: -No education needs identified at this time   Intake/Output Summary (Last 24 hours) at 11/23/12 1209 Last data filed at 11/23/12 0931  Gross per 24 hour  Intake   3343 ml  Output    750 ml  Net   2593 ml    Last BM: 11/20  Labs:   Recent Labs Lab 11/22/12 1430 11/23/12 0600  NA 134* 130*  K 3.6 3.6  CL 91* 90*  CO2 26 27  BUN 8 7  CREATININE 0.79 0.73  CALCIUM 10.3 9.8  GLUCOSE 88 102*    CBG (last 3)  No results found for this basename: GLUCAP,  in the last 72 hours  Scheduled Meds: . docusate sodium  100 mg Oral BID  . heparin  5,000 Units Subcutaneous Q8H  . sodium chloride  3 mL Intravenous Q12H    Continuous Infusions: . sodium chloride 30 mL/hr at 11/23/12 0931    Past Medical History  Diagnosis Date  . Hypertension   . Hypothyroidism   . Hyperlipidemia   . Dehydration     Past Surgical History  Procedure Laterality Date  . Irrigation and debridement abscess Left 05/04/2012    Procedure: MINOR INCISION AND DRAINAGE OF ABSCESS;  Surgeon: Wyn Forster., MD;  Location: [facility];  Service: Orthopedics;  Laterality: Left;    Jarold Motto MS, RD, LDN Pager: [phone] After-hours pager: [phone]

## 2012-11-23 NOTE — Progress Notes (Signed)
Patient resting comfortably in bed, no questions or concerns at this time.  Phone and call bell within reach.  Will continue to monitor.

## 2012-11-23 NOTE — Progress Notes (Signed)
Co-signed for Shawn Cole for medications administration, Vital signs, care plans, progress notes, and patient education. Vada Swift, RN/BSN  

## 2012-11-23 NOTE — Consult Note (Signed)
Urology Consult   Physician requesting consult: Orpah Cobb  Reason for consult: Renal mass   History of Present Illness: Shawn Cole is a 62 y.o. male with PMH significant for HTN, hypothyroidism, and hyperlipidemia who was admitted from his PCPs office after presenting with several weeks of poor PO intake, weight loss, and abdominal discomfort. Hospital  CT scan was performed and revealed a 12.7 x 11.3 x 11.5 cm left upper pole renal mass highly concerning for renal neoplasm, a 5.2cm left adrenal mass, an 11 mm right adrenal mass, and multiple pulmonary nodules/masses highly concerning for metastatic disease.   Pt states his sx began approx 3-4 weeks ago.  He also c/o excessive sputum production but denies coughing and hemoptysis.  He also denies vomiting but states that the sputum "makes it hard to keep stuff down and it comes back in my throat".  He denies a history of voiding or storage urinary symptoms, hematuria, UTIs, STDs, urolithiasis, GU malignancy/trauma/surgery.  He is currently resting and constantly spitting out sputum with no cough.  He denies HA, CP, SOB, N/V, and diarrhea/constipation.  He is "cold all the time".    Past Medical History  Diagnosis Date  . Hypertension   . Hypothyroidism   . Hyperlipidemia   . Dehydration     Past Surgical History  Procedure Laterality Date  . Irrigation and debridement abscess Left 05/04/2012    Procedure: MINOR INCISION AND DRAINAGE OF ABSCESS;  Surgeon: Wyn Forster., MD;  Location: [facility];  Service: Orthopedics;  Laterality: Left;    Current Hospital Medications:  Home Meds:    Medication List    ASK your doctor about these medications       amLODipine 10 MG tablet  Commonly known as:  NORVASC  Take 10 mg by mouth daily.     aspirin 81 MG tablet  Take 81 mg by mouth daily.     atorvastatin 10 MG tablet  Commonly known as:  LIPITOR  Take 10 mg by mouth daily.     levothyroxine 25 MCG tablet   Commonly known as:  SYNTHROID, LEVOTHROID  Take 25 mcg by mouth daily before breakfast.     metoprolol 100 MG tablet  Commonly known as:  LOPRESSOR  Take 100 mg by mouth 1 day or 1 dose.     oxyCODONE-acetaminophen 5-325 MG per tablet  Commonly known as:  PERCOCET/ROXICET  One or two tablets every 4 to 6 hours as needed for pain        Scheduled Meds: . docusate sodium  100 mg Oral BID  . feeding supplement (ENSURE COMPLETE)  237 mL Oral Q24H  . heparin  5,000 Units Subcutaneous Q8H  . sodium chloride  3 mL Intravenous Q12H   Continuous Infusions: . sodium chloride 30 mL/hr at 11/23/12 0931   PRN Meds:.acetaminophen, acetaminophen, ondansetron (ZOFRAN) IV, ondansetron  Allergies:  Allergies  Allergen Reactions  . Contrast Media [Iodinated Diagnostic Agents] Other (See Comments)    "couldn"t use hands or walk, sob"    History reviewed. No pertinent family history.  Social History:  reports that he quit smoking about 15 years ago. He has never used smokeless tobacco. He reports that he does not drink alcohol or use illicit drugs.  ROS: A complete review of systems was performed.  All systems are negative except for pertinent findings as noted.  Physical Exam:  Vital signs in last 24 hours: Temp:  [98.2 F (36.8 C)-98.8 F (37.1 C)] 98.2  F (36.8 C) (11/20 0545) Pulse Rate:  [77-92] 77 (11/20 0545) Resp:  [18-20] 18 (11/20 0545) BP: (109-112)/(66-68) 109/68 mmHg (11/20 0545) SpO2:  [97 %-99 %] 97 % (11/20 0545) Weight:  [96.2 kg (212 lb 1.3 oz)] 96.2 kg (212 lb 1.3 oz) (11/20 0545) Constitutional:  Alert and oriented, No acute distress Cardiovascular: Regular rate and rhythm Respiratory: Normal respiratory effort GI: Abdomen is soft, nondistended, no abdominal masses; mildly tender to palp in LUQ just below ribs GU: No CVA tenderness Lymphatic: No lymphadenopathy Neurologic: Grossly intact, no focal deficits Psychiatric: Normal mood and affect  Laboratory  Data:   Recent Labs  11/22/12 1430 11/23/12 0600  WBC 5.3 5.2  HGB 10.2* 10.2*  HCT 35.0* 33.8*  PLT 253 243     Recent Labs  11/22/12 1430 11/23/12 0600  NA 134* 130*  K 3.6 3.6  CL 91* 90*  GLUCOSE 88 102*  BUN 8 7  CALCIUM 10.3 9.8  CREATININE 0.79 0.73     Results for orders placed during the hospital encounter of 11/22/12 (from the past 24 hour(s))  BASIC METABOLIC PANEL     Status: Abnormal   Collection Time    11/23/12  6:00 AM      Result Value Range   Sodium 130 (*) 135 - 145 mEq/L   Potassium 3.6  3.5 - 5.1 mEq/L   Chloride 90 (*) 96 - 112 mEq/L   CO2 27  19 - 32 mEq/L   Glucose, Bld 102 (*) 70 - 99 mg/dL   BUN 7  6 - 23 mg/dL   Creatinine, Ser 1.61  0.50 - 1.35 mg/dL   Calcium 9.8  8.4 - 09.6 mg/dL   GFR calc non Af Amer >90  >90 mL/min   GFR calc Af Amer >90  >90 mL/min  CBC     Status: Abnormal   Collection Time    11/23/12  6:00 AM      Result Value Range   WBC 5.2  4.0 - 10.5 K/uL   RBC 5.03  4.22 - 5.81 MIL/uL   Hemoglobin 10.2 (*) 13.0 - 17.0 g/dL   HCT 04.5 (*) 40.9 - 81.1 %   MCV 67.2 (*) 78.0 - 100.0 fL   MCH 20.3 (*) 26.0 - 34.0 pg   MCHC 30.2  30.0 - 36.0 g/dL   RDW 91.4 (*) 78.2 - 95.6 %   Platelets 243  150 - 400 K/uL  IRON AND TIBC     Status: Abnormal   Collection Time    11/23/12  6:00 AM      Result Value Range   Iron 14 (*) 42 - 135 ug/dL   TIBC 213 (*) 086 - 578 ug/dL   Saturation Ratios 7 (*) 20 - 55 %   UIBC 188  125 - 400 ug/dL  FERRITIN     Status: Abnormal   Collection Time    11/23/12  6:00 AM      Result Value Range   Ferritin 892 (*) 22 - 322 ng/mL  LIPASE, BLOOD     Status: Abnormal   Collection Time    11/23/12  6:00 AM      Result Value Range   Lipase 10 (*) 11 - 59 U/L   No results found for this or any previous visit (from the past 240 hour(s)).  Renal Function:  Recent Labs  11/22/12 1430 11/23/12 0600  CREATININE 0.79 0.73   Estimated Creatinine Clearance: 111 ml/min (by C-G formula  based  on Cr of 0.73).  Radiologic Imaging: Ct Abdomen Pelvis Wo Contrast  11/23/2012   CLINICAL DATA:  Abdominal pain and weight loss.  EXAM: CT ABDOMEN AND PELVIS WITHOUT CONTRAST  TECHNIQUE: Multidetector CT imaging of the abdomen and pelvis was performed following the standard protocol without intravenous contrast.  COMPARISON:  Acute abdominal series November 20, 2012  FINDINGS: Included view of the lung bases demonstrates multiple pulmonary nodules and masses, including right middle lobe 3 x 4.2 cm mass, right lower lobe 1.7 x 1.1 cm mass, at least for pleural-based nodules along the diaphragm largest measuring 13 mm. 8 mm left lower lobe ground-glass nodule. Included heart appears mildly enlarged, pericardium is nonsuspicious.  12.7 x 11.3 x 11.5 cm left upper pole renal mass, with minimal perirenal stranding, the kidney is mildly displaced inferiorly due to the mass. No hydronephrosis or nephrolithiasis. The right kidney is unremarkable.  Left adrenal 4.7 x 5.2 cm mass, right adrenal 11 mm mass. Small retroperitoneal lymph nodes which are not pathologically enlarged by size criteria.  The stomach, small and large bowel are overall normal in course and caliber without inflammatory changes, contrast is yet to reach the low distal small bowel. Mild amount of retained large bowel stool.  The liver, spleen, and gallbladder are unremarkable. Fatty pancreas. Great vessels are normal in course and caliber with mild calcific atherosclerosis. Prostate is 5.1 cm in transaxial dimension and mildly invades the base of the urinary bladder.  Small fat containing inguinal hernias, small fat containing umbilical hernia. Mild degenerative change of the spine, 7 mm sclerotic lesion left iliac bone, axial 66/101, patient is osteopenic with lucent appearance of the sacrum, left greater than right, for example axial 66/101. Multiple remote apparent anterior rib fractures.  IMPRESSION: At least 12.7 x 11.3 x 11.5 cm left upper pole  renal mass, difficult to further characterize on this noncontrast examination, highly concerning for primary renal neoplasm.  5.2 cm left adrenal mass, 11 mm right adrenal mass in addition to multiple pulmonary nodules /masses, largest in the right middle lobe measuring 3 x 4.2 cm, highly concerning for metastatic disease. Patient is osteopenic with lucencies in the sacrum may reflect focal osteopenia, less likely lytic metastasis, sclerotic 7 mm left iliac bone lesion. PET-CT could be performed to evaluate for extent of disease.   Electronically Signed   By: Awilda Metro   On: 11/23/2012 05:20   X-ray Chest Pa And Lateral   11/23/2012   CLINICAL DATA:  Atrial fibrillation.  Chest pain.  EXAM: CHEST  2 VIEW  COMPARISON:  03/03/2004  FINDINGS: Low lung volumes are present, causing crowding of the pulmonary vasculature. Apical lordotic projection accentuates cardiac size. Mild cardiomegaly. Thoracic spondylosis is present. Peripheral density in the left mid chest.  IMPRESSION: 1. Peripheral density left mid chest. Although this may partially be due to superimposed bony structures, cannot exclude peripheral nodule or pneumonia. Repeat chest radiography or chest CT recommended for further workup. 2. Mild cardiomegaly. 3. Thoracic spondylosis. 4. Low lung volumes.   Electronically Signed   By: Herbie Baltimore M.D.   On: 11/23/2012 02:53    Impression/Recommendation Newly diagnosed large left renal mass, bilateral adrenal masses, and pulmonary nodules--likely primary renal neoplasm with metastases.  PET CT will be reviewed by Dr. Brunilda Payor after completion.  Will await eval by Oncology and their recommendations for a treatment plan to determine if cytoreductive nephrectomy will benefit pt.     Silas Flood 11/23/2012, 3:03 PM

## 2012-11-23 NOTE — Progress Notes (Signed)
Utilization review completed. Ziana Heyliger, RN, BSN. 

## 2012-11-23 NOTE — Progress Notes (Signed)
.   Ct scan tech made aware that pt had contrast rxn in the 1980's. Also DR Algie Coffer made aware of contrast rxn. Pt going for ct w/o contrast as ordered by Dr. Algie Coffer, but pt refused ct scan.

## 2012-11-24 ENCOUNTER — Encounter (HOSPITAL_COMMUNITY): Payer: Self-pay | Admitting: Radiology

## 2012-11-24 DIAGNOSIS — E279 Disorder of adrenal gland, unspecified: Secondary | ICD-10-CM

## 2012-11-24 DIAGNOSIS — R918 Other nonspecific abnormal finding of lung field: Secondary | ICD-10-CM

## 2012-11-24 DIAGNOSIS — N289 Disorder of kidney and ureter, unspecified: Secondary | ICD-10-CM

## 2012-11-24 LAB — PROTIME-INR
INR: 1.17 (ref 0.00–1.49)
Prothrombin Time: 14.7 seconds (ref 11.6–15.2)

## 2012-11-24 MED ORDER — HEPARIN SODIUM (PORCINE) 5000 UNIT/ML IJ SOLN
5000.0000 [IU] | Freq: Three times a day (TID) | INTRAMUSCULAR | Status: DC
  Administered 2012-11-24 – 2012-11-26 (×6): 5000 [IU] via SUBCUTANEOUS
  Filled 2012-11-24 (×8): qty 1

## 2012-11-24 MED ORDER — FERROUS SULFATE 325 (65 FE) MG PO TABS
325.0000 mg | ORAL_TABLET | Freq: Two times a day (BID) | ORAL | Status: DC
  Administered 2012-11-25 – 2012-11-26 (×4): 325 mg via ORAL
  Filled 2012-11-24 (×5): qty 1

## 2012-11-24 NOTE — Progress Notes (Signed)
IR aware of request for biopsy of renal mass. Imaging reviewed by Dr. Miles Costain Large left renal mass with adrenal masses, pulm nodules/masses, likely consistent with metastatic renal cell carcinoma Urology consult noted. Oncology reportedly to consult as well. Would defer biopsy of renal mass for now until Oncology consult. Also agree with recommendation for PET/CT to assess disease extent. This may also offer up a less risky place to biopsy if tissue diagnosis is needed.  Brayton El PA-C Interventional Radiology 11/24/2012 9:04 AM

## 2012-11-24 NOTE — Consult Note (Signed)
HPI: Shawn Cole is an 62 y.o. male admitted with findings of a large left renal mass. Initially reviewed imaging and agreed with Urology recs for Oncology consult and possibly PET/CT. However, Oncology reportedly wants to move forward with biopsy for tissue diagnosis. Dr. Miles Costain is agreeable to this. PMHx and meds reviewed.  Past Medical History:  Past Medical History  Diagnosis Date  . Hypertension   . Hypothyroidism   . Hyperlipidemia   . Dehydration     Past Surgical History:  Past Surgical History  Procedure Laterality Date  . Irrigation and debridement abscess Left 05/04/2012    Procedure: MINOR INCISION AND DRAINAGE OF ABSCESS;  Surgeon: Wyn Forster., MD;  Location: [facility];  Service: Orthopedics;  Laterality: Left;    Family History: History reviewed. No pertinent family history.  Social History:  reports that he quit smoking about 15 years ago. He has never used smokeless tobacco. He reports that he does not drink alcohol or use illicit drugs.  Allergies:  Allergies  Allergen Reactions  . Contrast Media [Iodinated Diagnostic Agents] Other (See Comments)    "couldn"t use hands or walk, sob"    Medications:   Medication List    ASK your doctor about these medications       amLODipine 10 MG tablet  Commonly known as:  NORVASC  Take 10 mg by mouth daily.     aspirin 81 MG tablet  Take 81 mg by mouth daily.     atorvastatin 10 MG tablet  Commonly known as:  LIPITOR  Take 10 mg by mouth daily.     levothyroxine 25 MCG tablet  Commonly known as:  SYNTHROID, LEVOTHROID  Take 25 mcg by mouth daily before breakfast.     metoprolol 100 MG tablet  Commonly known as:  LOPRESSOR  Take 100 mg by mouth 1 day or 1 dose.     oxyCODONE-acetaminophen 5-325 MG per tablet  Commonly known as:  PERCOCET/ROXICET  One or two tablets every 4 to 6 hours as needed for pain        Please HPI for pertinent positives, otherwise complete 10 system ROS  negative.  Physical Exam: BP 104/66  Pulse 86  Temp(Src) 98.2 F (36.8 C) (Oral)  Resp 18  Ht 5\' 9"  (1.753 m)  Wt 211 lb 10.3 oz (96 kg)  BMI 31.24 kg/m2  SpO2 98% Body mass index is 31.24 kg/(m^2).   General Appearance:  Alert, cooperative, no distress, appears stated age  Head:  Normocephalic, without obvious abnormality, atraumatic  ENT: Unremarkable  Neck: Supple, symmetrical, trachea midline  Lungs:   Clear to auscultation bilaterally, no w/r/r.  Chest Wall:  No tenderness or deformity  Heart:  Regular rate and rhythm, S1, S2 normal, no murmur, rub or gallop.  Abdomen:   Soft, non-tender, non distended.  Neurologic: Normal affect, no gross deficits.   Results for orders placed during the hospital encounter of 11/22/12 (from the past 48 hour(s))  COMPREHENSIVE METABOLIC PANEL     Status: Abnormal   Collection Time    11/22/12  2:30 PM      Result Value Range   Sodium 134 (*) 135 - 145 mEq/L   Potassium 3.6  3.5 - 5.1 mEq/L   Chloride 91 (*) 96 - 112 mEq/L   CO2 26  19 - 32 mEq/L   Glucose, Bld 88  70 - 99 mg/dL   BUN 8  6 - 23 mg/dL   Creatinine, Ser 1.30  0.50 - 1.35 mg/dL   Calcium 40.9  8.4 - 81.1 mg/dL   Total Protein 9.0 (*) 6.0 - 8.3 g/dL   Albumin 3.0 (*) 3.5 - 5.2 g/dL   AST 10  0 - 37 U/L   ALT 6  0 - 53 U/L   Alkaline Phosphatase 64  39 - 117 U/L   Total Bilirubin 0.6  0.3 - 1.2 mg/dL   GFR calc non Af Amer >90  >90 mL/min   GFR calc Af Amer >90  >90 mL/min   Comment: (NOTE)     The eGFR has been calculated using the CKD EPI equation.     This calculation has not been validated in all clinical situations.     eGFR's persistently <90 mL/min signify possible Chronic Kidney     Disease.  CBC WITH DIFFERENTIAL     Status: Abnormal   Collection Time    11/22/12  2:30 PM      Result Value Range   WBC 5.3  4.0 - 10.5 K/uL   RBC 5.21  4.22 - 5.81 MIL/uL   Hemoglobin 10.2 (*) 13.0 - 17.0 g/dL   HCT 91.4 (*) 78.2 - 95.6 %   MCV 67.2 (*) 78.0 - 100.0 fL    MCH 19.6 (*) 26.0 - 34.0 pg   MCHC 29.1 (*) 30.0 - 36.0 g/dL   RDW 21.3 (*) 08.6 - 57.8 %   Platelets 253  150 - 400 K/uL   Neutrophils Relative % 56  43 - 77 %   Lymphocytes Relative 33  12 - 46 %   Monocytes Relative 10  3 - 12 %   Eosinophils Relative 1  0 - 5 %   Basophils Relative 0  0 - 1 %   Neutro Abs 2.9  1.7 - 7.7 K/uL   Lymphs Abs 1.8  0.7 - 4.0 K/uL   Monocytes Absolute 0.5  0.1 - 1.0 K/uL   Eosinophils Absolute 0.1  0.0 - 0.7 K/uL   Basophils Absolute 0.0  0.0 - 0.1 K/uL   Smear Review MORPHOLOGY UNREMARKABLE    TSH     Status: None   Collection Time    11/22/12  2:30 PM      Result Value Range   TSH 3.380  0.350 - 4.500 uIU/mL   Comment: Performed at [facility]  BASIC METABOLIC PANEL     Status: Abnormal   Collection Time    11/23/12  6:00 AM      Result Value Range   Sodium 130 (*) 135 - 145 mEq/L   Potassium 3.6  3.5 - 5.1 mEq/L   Chloride 90 (*) 96 - 112 mEq/L   CO2 27  19 - 32 mEq/L   Glucose, Bld 102 (*) 70 - 99 mg/dL   BUN 7  6 - 23 mg/dL   Creatinine, Ser 4.69  0.50 - 1.35 mg/dL   Calcium 9.8  8.4 - 62.9 mg/dL   GFR calc non Af Amer >90  >90 mL/min   GFR calc Af Amer >90  >90 mL/min   Comment: (NOTE)     The eGFR has been calculated using the CKD EPI equation.     This calculation has not been validated in all clinical situations.     eGFR's persistently <90 mL/min signify possible Chronic Kidney     Disease.  CBC     Status: Abnormal   Collection Time    11/23/12  6:00 AM  Result Value Range   WBC 5.2  4.0 - 10.5 K/uL   RBC 5.03  4.22 - 5.81 MIL/uL   Hemoglobin 10.2 (*) 13.0 - 17.0 g/dL   HCT 81.1 (*) 91.4 - 78.2 %   MCV 67.2 (*) 78.0 - 100.0 fL   MCH 20.3 (*) 26.0 - 34.0 pg   MCHC 30.2  30.0 - 36.0 g/dL   RDW 95.6 (*) 21.3 - 08.6 %   Platelets 243  150 - 400 K/uL  IRON AND TIBC     Status: Abnormal   Collection Time    11/23/12  6:00 AM      Result Value Range   Iron 14 (*) 42 - 135 ug/dL   TIBC 578 (*) 469 - 629 ug/dL    Saturation Ratios 7 (*) 20 - 55 %   UIBC 188  125 - 400 ug/dL   Comment: Performed at [facility]  FERRITIN     Status: Abnormal   Collection Time    11/23/12  6:00 AM      Result Value Range   Ferritin 892 (*) 22 - 322 ng/mL   Comment: Performed at [facility]  LIPASE, BLOOD     Status: Abnormal   Collection Time    11/23/12  6:00 AM      Result Value Range   Lipase 10 (*) 11 - 59 U/L   Ct Abdomen Pelvis Wo Contrast  11/23/2012   CLINICAL DATA:  Abdominal pain and weight loss.  EXAM: CT ABDOMEN AND PELVIS WITHOUT CONTRAST  TECHNIQUE: Multidetector CT imaging of the abdomen and pelvis was performed following the standard protocol without intravenous contrast.  COMPARISON:  Acute abdominal series November 20, 2012  FINDINGS: Included view of the lung bases demonstrates multiple pulmonary nodules and masses, including right middle lobe 3 x 4.2 cm mass, right lower lobe 1.7 x 1.1 cm mass, at least for pleural-based nodules along the diaphragm largest measuring 13 mm. 8 mm left lower lobe ground-glass nodule. Included heart appears mildly enlarged, pericardium is nonsuspicious.  12.7 x 11.3 x 11.5 cm left upper pole renal mass, with minimal perirenal stranding, the kidney is mildly displaced inferiorly due to the mass. No hydronephrosis or nephrolithiasis. The right kidney is unremarkable.  Left adrenal 4.7 x 5.2 cm mass, right adrenal 11 mm mass. Small retroperitoneal lymph nodes which are not pathologically enlarged by size criteria.  The stomach, small and large bowel are overall normal in course and caliber without inflammatory changes, contrast is yet to reach the low distal small bowel. Mild amount of retained large bowel stool.  The liver, spleen, and gallbladder are unremarkable. Fatty pancreas. Great vessels are normal in course and caliber with mild calcific atherosclerosis. Prostate is 5.1 cm in transaxial dimension and mildly invades the base of the urinary bladder.  Small  fat containing inguinal hernias, small fat containing umbilical hernia. Mild degenerative change of the spine, 7 mm sclerotic lesion left iliac bone, axial 66/101, patient is osteopenic with lucent appearance of the sacrum, left greater than right, for example axial 66/101. Multiple remote apparent anterior rib fractures.  IMPRESSION: At least 12.7 x 11.3 x 11.5 cm left upper pole renal mass, difficult to further characterize on this noncontrast examination, highly concerning for primary renal neoplasm.  5.2 cm left adrenal mass, 11 mm right adrenal mass in addition to multiple pulmonary nodules /masses, largest in the right middle lobe measuring 3 x 4.2 cm, highly concerning for metastatic disease. Patient is  osteopenic with lucencies in the sacrum may reflect focal osteopenia, less likely lytic metastasis, sclerotic 7 mm left iliac bone lesion. PET-CT could be performed to evaluate for extent of disease.   Electronically Signed   By: Awilda Metro   On: 11/23/2012 05:20   X-ray Chest Pa And Lateral   11/23/2012   CLINICAL DATA:  Atrial fibrillation.  Chest pain.  EXAM: CHEST  2 VIEW  COMPARISON:  03/03/2004  FINDINGS: Low lung volumes are present, causing crowding of the pulmonary vasculature. Apical lordotic projection accentuates cardiac size. Mild cardiomegaly. Thoracic spondylosis is present. Peripheral density in the left mid chest.  IMPRESSION: 1. Peripheral density left mid chest. Although this may partially be due to superimposed bony structures, cannot exclude peripheral nodule or pneumonia. Repeat chest radiography or chest CT recommended for further workup. 2. Mild cardiomegaly. 3. Thoracic spondylosis. 4. Low lung volumes.   Electronically Signed   By: Herbie Baltimore M.D.   On: 11/23/2012 02:53    Assessment/Plan Large left renal mass Additional masses concerning for metastatic disease. Discussed US guided biopsy of lesion. Explained procedure to pt and sister, including risks of  infections, bleeding/hemorrhage, hematuria, use of sedation and potential adverse effects of sedation. The pt is agreeable and consent is signed in chart Has been NPO all morning. Got heparin sub-q at 0530  Brayton El PA-C 11/24/2012, 12:42 PM

## 2012-11-24 NOTE — Progress Notes (Signed)
Subjective:  Per Dr. Brunilda Payor kidney biopsy is not necessary for almost certain renal cell CA and debulking surgery can be done without biopsy. OP PET scan. Urology and Oncology assistance with further care is appreciated. Patient now willing to go for surgery and oncology treatment as needed.  Objective:  Vital Signs in the last 24 hours: Temp:  [98.2 F (36.8 C)-98.8 F (37.1 C)] 98.8 F (37.1 C) (11/21 1313) Pulse Rate:  [86-108] 108 (11/21 1313) Cardiac Rhythm:  [-] Atrial fibrillation (11/21 0901) Resp:  [18-20] 18 (11/21 1313) BP: (101-111)/(66-70) 101/68 mmHg (11/21 1313) SpO2:  [96 %-99 %] 96 % (11/21 1313) Weight:  [96 kg (211 lb 10.3 oz)] 96 kg (211 lb 10.3 oz) (11/21 8295)  Physical Exam: BP Readings from Last 1 Encounters:  11/24/12 101/68     Wt Readings from Last 1 Encounters:  11/24/12 96 kg (211 lb 10.3 oz)    Weight change: 1.7 kg (3 lb 12 oz)  HEENT: /AT, Eyes-Brown, PERL, EOMI, Conjunctiva-Pink, Sclera-Non-icteric Neck: No JVD, No bruit, Trachea midline. Lungs:  Clear, Bilateral. Cardiac:  Regular rhythm, normal S1 and S2, no S3.  Abdomen:  Soft, non-tender. Extremities:  No edema present. No cyanosis. No clubbing. CNS: AxOx3, Cranial nerves grossly intact, moves all 4 extremities. Right handed. Skin: Warm and dry.   Intake/Output from previous day: 11/20 0701 - 11/21 0700 In: 3137.5 [P.O.:1280; I.V.:1607.5; IV Piggyback:250] Out: 1700 [Urine:1700]    Lab Results: BMET    Component Value Date/Time   NA 130* 11/23/2012 0600   K 3.6 11/23/2012 0600   CL 90* 11/23/2012 0600   CO2 27 11/23/2012 0600   GLUCOSE 102* 11/23/2012 0600   BUN 7 11/23/2012 0600   CREATININE 0.73 11/23/2012 0600   CALCIUM 9.8 11/23/2012 0600   GFRNONAA >90 11/23/2012 0600   GFRAA >90 11/23/2012 0600   CBC    Component Value Date/Time   WBC 5.2 11/23/2012 0600   RBC 5.03 11/23/2012 0600   HGB 10.2* 11/23/2012 0600   HCT 33.8* 11/23/2012 0600   PLT 243 11/23/2012 0600    MCV 67.2* 11/23/2012 0600   MCH 20.3* 11/23/2012 0600   MCHC 30.2 11/23/2012 0600   RDW 16.8* 11/23/2012 0600   LYMPHSABS 1.8 11/22/2012 1430   MONOABS 0.5 11/22/2012 1430   EOSABS 0.1 11/22/2012 1430   BASOSABS 0.0 11/22/2012 1430   CARDIAC ENZYMES No results found for this basename: CKTOTAL, CKMB, CKMBINDEX, TROPONINI    Scheduled Meds: . azithromycin  500 mg Intravenous Q24H  . docusate sodium  100 mg Oral BID  . feeding supplement (ENSURE COMPLETE)  237 mL Oral Q24H  . [START ON 11/25/2012] ferrous sulfate  325 mg Oral BID WC  . heparin  5,000 Units Subcutaneous Q8H  . sodium chloride  3 mL Intravenous Q12H   Continuous Infusions: . sodium chloride 30 mL/hr at 11/23/12 0931   PRN Meds:.acetaminophen, acetaminophen, ondansetron (ZOFRAN) IV, ondansetron  Assessment/Plan: Dehydration-improving  Hypothyroidism  Obesity  Abdominal pain  Weight loss with loss of appetite  Hyponatremia  Anemia-r/o iron deficiency  Left renal mass  Pulmonary nodules  Home soon.  OP Urology and Oncology f/u and treatments.   LOS: 2 days    Orpah Cobb  MD  11/24/2012, 6:30 PM

## 2012-11-24 NOTE — Progress Notes (Signed)
Subjective: Patient reports : Weight loss ( 6 lbs in the last 2 weeks.  Abdominal pain. Poor appetite.  Objective: Vital signs in last 24 hours: Temp:  [98.2 F (36.8 C)-98.8 F (37.1 C)] 98.8 F (37.1 C) (11/21 1313) Pulse Rate:  [86-108] 108 (11/21 1313) Resp:  [18-20] 18 (11/21 1313) BP: (101-111)/(66-70) 101/68 mmHg (11/21 1313) SpO2:  [96 %-99 %] 96 % (11/21 1313) Weight:  [96 kg (211 lb 10.3 oz)] 96 kg (211 lb 10.3 oz) (11/21 0624)  Intake/Output from previous day: 11/20 0701 - 11/21 0700 In: 3137.5 [P.O.:1280; I.V.:1607.5; IV Piggyback:250] Out: 1700 [Urine:1700] Intake/Output this shift:   I had a lenghty discussion with Mr [name] and one of his sister this morning.  He probably has metastatic renal cell carcinoma.  I reviewed the CT scan with them. Mr [name] needs further work-up to assess the extent of his disease.  A Pet CT scan could provide more information.  We need to assess the anatomy of the renal vein whether there is tumor thrombus or not.  I reviewed the treatment options: cyto reductive left nephrectomy and adrenalectomy followed by adjuvant chemotherapy or neoadjuvant chemotherapy followed by radical nephrectomy if he responds to to chemo. In that case he would need a biopsy for tissue diagnosis.  He states that quality of life is important to him and he could choose not to do anything and in that case his life expectancy could be six to 12 months with worsening of his current symptoms.  Will discuss this with Dr Algie Coffer and Dr Clelia Croft for their imput.      Lab Results:  Recent Labs  11/22/12 1430 11/23/12 0600  HGB 10.2* 10.2*  HCT 35.0* 33.8*   BMET  Recent Labs  11/22/12 1430 11/23/12 0600  NA 134* 130*  K 3.6 3.6  CL 91* 90*  CO2 26 27  GLUCOSE 88 102*  BUN 8 7  CREATININE 0.79 0.73  CALCIUM 10.3 9.8    Recent Labs  11/24/12 1315  INR 1.17   No results found for this basename: LABURIN,  in the last 72 hours Results for orders placed  during the hospital encounter of 05/04/12  AFB CULTURE WITH SMEAR     Status: None   Collection Time    05/04/12  2:10 PM      Result Value Range Status   Specimen Description ABSCESS FINGER LEFT   Final   Special Requests LEFT LONG FINGER   Final   ACID FAST SMEAR NO ACID FAST BACILLI SEEN   Final   Culture NO ACID FAST BACILLI ISOLATED IN 6 WEEKS   Final   Report Status 06/16/2012 FINAL   Final  ANAEROBIC CULTURE     Status: None   Collection Time    05/04/12  2:10 PM      Result Value Range Status   Specimen Description ABSCESS FINGER LEFT   Final   Special Requests LEFT LONG FINGER   Final   Gram Stain     Final   Value: FEW WBC PRESENT,BOTH PMN AND MONONUCLEAR     NO SQUAMOUS EPITHELIAL CELLS SEEN     FEW GRAM POSITIVE COCCI     IN PAIRS IN CHAINS   Culture NO ANAEROBES ISOLATED   Final   Report Status 05/09/2012 FINAL   Final  FUNGUS CULTURE W SMEAR     Status: None   Collection Time    05/04/12  2:10 PM      Result Value  Range Status   Specimen Description ABSCESS FINGER LEFT   Final   Special Requests LEFT LONG FINGER   Final   Fungal Smear NO YEAST OR FUNGAL ELEMENTS SEEN   Final   Culture No Fungi Isolated in 4 Weeks   Final   Report Status 06/02/2012 FINAL   Final  CULTURE, ROUTINE-ABSCESS     Status: None   Collection Time    05/04/12  2:10 PM      Result Value Range Status   Specimen Description ABSCESS FINGER LEFT   Final   Special Requests LEFT LONG FINGER   Final   Gram Stain     Final   Value: FEW WBC PRESENT,BOTH PMN AND MONONUCLEAR     NO SQUAMOUS EPITHELIAL CELLS SEEN     FEW GRAM POSITIVE COCCI     IN PAIRS IN CHAINS   Culture     Final   Value: MODERATE GROUP B STREP(S.AGALACTIAE)ISOLATED     Note: TESTING AGAINST S. AGALACTIAE NOT ROUTINELY PERFORMED DUE TO PREDICTABILITY OF AMP/PEN/VAN SUSCEPTIBILITY.   Report Status 05/07/2012 FINAL   Final    Studies/Results: Ct Abdomen Pelvis Wo Contrast  11/23/2012   CLINICAL DATA:  Abdominal pain and  weight loss.  EXAM: CT ABDOMEN AND PELVIS WITHOUT CONTRAST  TECHNIQUE: Multidetector CT imaging of the abdomen and pelvis was performed following the standard protocol without intravenous contrast.  COMPARISON:  Acute abdominal series November 20, 2012  FINDINGS: Included view of the lung bases demonstrates multiple pulmonary nodules and masses, including right middle lobe 3 x 4.2 cm mass, right lower lobe 1.7 x 1.1 cm mass, at least for pleural-based nodules along the diaphragm largest measuring 13 mm. 8 mm left lower lobe ground-glass nodule. Included heart appears mildly enlarged, pericardium is nonsuspicious.  12.7 x 11.3 x 11.5 cm left upper pole renal mass, with minimal perirenal stranding, the kidney is mildly displaced inferiorly due to the mass. No hydronephrosis or nephrolithiasis. The right kidney is unremarkable.  Left adrenal 4.7 x 5.2 cm mass, right adrenal 11 mm mass. Small retroperitoneal lymph nodes which are not pathologically enlarged by size criteria.  The stomach, small and large bowel are overall normal in course and caliber without inflammatory changes, contrast is yet to reach the low distal small bowel. Mild amount of retained large bowel stool.  The liver, spleen, and gallbladder are unremarkable. Fatty pancreas. Great vessels are normal in course and caliber with mild calcific atherosclerosis. Prostate is 5.1 cm in transaxial dimension and mildly invades the base of the urinary bladder.  Small fat containing inguinal hernias, small fat containing umbilical hernia. Mild degenerative change of the spine, 7 mm sclerotic lesion left iliac bone, axial 66/101, patient is osteopenic with lucent appearance of the sacrum, left greater than right, for example axial 66/101. Multiple remote apparent anterior rib fractures.  IMPRESSION: At least 12.7 x 11.3 x 11.5 cm left upper pole renal mass, difficult to further characterize on this noncontrast examination, highly concerning for primary renal  neoplasm.  5.2 cm left adrenal mass, 11 mm right adrenal mass in addition to multiple pulmonary nodules /masses, largest in the right middle lobe measuring 3 x 4.2 cm, highly concerning for metastatic disease. Patient is osteopenic with lucencies in the sacrum may reflect focal osteopenia, less likely lytic metastasis, sclerotic 7 mm left iliac bone lesion. PET-CT could be performed to evaluate for extent of disease.   Electronically Signed   By: Awilda Metro   On: 11/23/2012 05:20  Assessment/Plan:  Large left renal mass.  Bilateral adrenal tumors.  Multiple pulmonary nodules  The patient probably has metastatic renal cell carcinoma.    I do not think he needs a biopsy of the renal mass unless he refuses cytoreductive nephrectomy or elects to have neoadjuvant chemotherapy, in which case he would need tissue diagnosis.  I discussed this with Dr Clelia Croft.  He will see patient this week end and we will proceed as per his discussion with the patient.  The work-up could be completed as outpatient.     LOS: 2 days   Shawn Cole 11/24/2012, 8:00 PM

## 2012-11-24 NOTE — Consult Note (Signed)
[facility]  [phone]   ONCOLOGY  HOSPITAL CONSULTATION NOTE  Shawn Cole                                MR#: [id number]  DOB: 06/12/1950                       CSN#: [id number]  Referring MD: [facility]  Primary MD: Dr.  Jaquita Rector for Consult: Rule out renal cancer   NWG:NFAOZH Shawn Cole is a 62 y.o. male. Past is see for evaluation of the renal mass suspicious for malignancy. He  was admitted from his PCPs office after presenting with several weeks of poor oral  intake, weight loss, and diffuse abdominal discomfort.CT of the abdomen and pelvis without contrast on 11/23/2012 revealed a12.7 x 11.3 x 11.5 cm left upper pole renal mass, with minimal perirenal stranding, the kidney is mildly displaced inferiorly due to the mass. No hydronephrosis or nephrolithiasis. The right kidney is unremarkable. Left adrenal 4.7 x 5.2 cm mass and a  right adrenal 11 mm mass were noted. Small retroperitoneal lymph nodes( which are not pathologically enlarged by size criteria) were seen.  Prostate is 5.1 cm and mildly invades the base of the urinary bladder.A 7 mm sclerotic lesion at left iliac bone was seen in the setting of osteopenia. Multiple remote apparent anterior rib fractures.   His symptoms began around 4 weeks ago, along with excessive sputum production without hemoptysis or cough.these excessive mucous production causes nausea.He denies a history of voiding or urinary retention. No hematuria,no other symptoms are reported by the patient.  PET CT has been scheduled.he is to undergo biopsy of the left renal mass by interventional radiology likely today for tissue diagnosis. We have been requested to see the patient with recommendations for treatment planning in order to determine if cytoreductive nephrectomy  is of benefit to the patient    PMH:  Past Medical History  Diagnosis Date  . Hypertension   . Hypothyroidism   . Hyperlipidemia   . Dehydration   iron  deficiency anemia  Surgeries:  Past Surgical History  Procedure Laterality Date  . Irrigation and debridement abscess Left 05/04/2012    Procedure: MINOR INCISION AND DRAINAGE OF ABSCESS;  Surgeon: Wyn Forster., MD;  Location: [facility];  Service: Orthopedics;  Laterality: Left;    Allergies:  Allergies  Allergen Reactions  . Contrast Media [Iodinated Diagnostic Agents] Other (See Comments)    "couldn"t use hands or walk, sob"    Medications:   Prior to Admission:  Prescriptions prior to admission  Medication Sig Dispense Refill  . amLODipine (NORVASC) 10 MG tablet Take 10 mg by mouth daily.      Marland Kitchen aspirin 81 MG tablet Take 81 mg by mouth daily.      Marland Kitchen atorvastatin (LIPITOR) 10 MG tablet Take 10 mg by mouth daily.      Marland Kitchen levothyroxine (SYNTHROID, LEVOTHROID) 25 MCG tablet Take 25 mcg by mouth daily before breakfast.      . metoprolol (LOPRESSOR) 100 MG tablet Take 100 mg by mouth 1 day or 1 dose.      . oxyCODONE-acetaminophen (PERCOCET/ROXICET) 5-325 MG per tablet One or two tablets every 4 to 6 hours as needed for pain  30 tablet  0    YQM:VHQIONGEXBMWU, acetaminophen, ondansetron (ZOFRAN) IV, ondansetron  ROS: Constitutional: Positive for 60 pound weight  loss over the last 6 months of combined by decrease in appetite..Negative for fever, chills or  night sweats. Positive fatigue.  Eyes: Negative for blurred vision and double vision.  Respiratory: Negative for cough. No hemoptysis. No shortness of breath. No pleuritic chest pain.  Cardiovascular: Negative for chest pain. No palpitations.  GI: Negative for  nausea, vomiting, diarrhea or constipation. No change in bowel caliber. No  Melena or hematochezia. No abdominal pain at this time, however he had significant diffuse abdominal pain on admission. GU: Negative for hematuria. No loss of urinary control.No urinary retention. Skin: Negative for itching. No rash. No petechia. No easy  bruising Neurological:  No headaches. No motor or sensory deficits.  Family History:   Father died with prostate cancer. Mother died of old age. One sister deceased with  Apparent metastatic Gyn Ca. No family history of bleeding disorders.  Social History:  reports that he quit smoking about 15 years ago.until then, he smoked one pack a day for about 25 years. He has never used smokeless tobacco. He reports that he does not drink alcohol or use illicit drugs.he works at [employer].he is single, lives in [place] His sister, Shawn Cole is the main contact,   #[id number] is full code  Physical Exam    Filed Vitals:   11/24/12 0624  BP: 104/66  Pulse: 86  Temp: 98.2 F (36.8 C)  Resp: 18     Filed Weights   11/22/12 1219 11/23/12 0545 11/24/12 0624  Weight: 207 lb 14.3 oz (94.3 kg) 212 lb 1.3 oz (96.2 kg) 211 lb 10.3 oz (96 kg)   General: 62 year old African American male  in no acute distress A. and O. x3  well-developed and well-nourished.  HEENT: Normocephalic, atraumatic, PERRLA. Oral cavity without thrush or lesions. Neck supple. no thyromegaly, no cervical or supraclavicular adenopathy  Lungs clear bilaterally . No wheezing, rhonchi or rales. No axillary masses. Breasts: not examined. Cardiac regular rate and rhythm, no murmur , rubs or gallops Abdomen soft nontender , bowel sounds x4. No HSM. No masses palpable.  GU/rectal: deferred. Extremities no clubbing cyanosis or edema. No bruising or petechial rash Musculoskeletal: no spinal tenderness.  Neuro: Non Focal  Labs:  CBC   Recent Labs Lab 11/22/12 1430 11/23/12 0600  WBC 5.3 5.2  HGB 10.2* 10.2*  HCT 35.0* 33.8*  PLT 253 243  MCV 67.2* 67.2*  MCH 19.6* 20.3*  MCHC 29.1* 30.2  RDW 16.9* 16.8*  LYMPHSABS 1.8  --   MONOABS 0.5  --   EOSABS 0.1  --   BASOSABS 0.0  --      CMP    Recent Labs Lab 11/22/12 1430 11/23/12 0600  NA 134* 130*  K 3.6 3.6  CL 91* 90*  CO2 26 27  GLUCOSE 88 102*  BUN 8 7  CREATININE  0.79 0.73  CALCIUM 10.3 9.8  AST 10  --   ALT 6  --   ALKPHOS 64  --   BILITOT 0.6  --         Component Value Date/Time   BILITOT 0.6 11/22/2012 1430     No results found for this basename: INR, PROTIME,  in the last 168 hours  No results found for this basename: DDIMER,  in the last 72 hours   Anemia panel:   Recent Labs  11/23/12 0600  FERRITIN 892*  TIBC 202*  IRON 14*     Imaging Studies:  Ct Abdomen Pelvis Wo Contrast  11/23/2012   CLINICAL DATA:  Abdominal pain and weight loss.  EXAM: CT ABDOMEN AND PELVIS WITHOUT CONTRAST  TECHNIQUE: Multidetector CT imaging of the abdomen and pelvis was performed following the standard protocol without intravenous contrast.  COMPARISON:  Acute abdominal series November 20, 2012  FINDINGS: Included view of the lung bases demonstrates multiple pulmonary nodules and masses, including right middle lobe 3 x 4.2 cm mass, right lower lobe 1.7 x 1.1 cm mass, at least for pleural-based nodules along the diaphragm largest measuring 13 mm. 8 mm left lower lobe ground-glass nodule. Included heart appears mildly enlarged, pericardium is nonsuspicious.  12.7 x 11.3 x 11.5 cm left upper pole renal mass, with minimal perirenal stranding, the kidney is mildly displaced inferiorly due to the mass. No hydronephrosis or nephrolithiasis. The right kidney is unremarkable.  Left adrenal 4.7 x 5.2 cm mass, right adrenal 11 mm mass. Small retroperitoneal lymph nodes which are not pathologically enlarged by size criteria.  The stomach, small and large bowel are overall normal in course and caliber without inflammatory changes, contrast is yet to reach the low distal small bowel. Mild amount of retained large bowel stool.  The liver, spleen, and gallbladder are unremarkable. Fatty pancreas. Great vessels are normal in course and caliber with mild calcific atherosclerosis. Prostate is 5.1 cm in transaxial dimension and mildly invades the base of the urinary bladder.   Small fat containing inguinal hernias, small fat containing umbilical hernia. Mild degenerative change of the spine, 7 mm sclerotic lesion left iliac bone, axial 66/101, patient is osteopenic with lucent appearance of the sacrum, left greater than right, for example axial 66/101. Multiple remote apparent anterior rib fractures.  IMPRESSION: At least 12.7 x 11.3 x 11.5 cm left upper pole renal mass, difficult to further characterize on this noncontrast examination, highly concerning for primary renal neoplasm.  5.2 cm left adrenal mass, 11 mm right adrenal mass in addition to multiple pulmonary nodules /masses, largest in the right middle lobe measuring 3 x 4.2 cm, highly concerning for metastatic disease. Patient is osteopenic with lucencies in the sacrum may reflect focal osteopenia, less likely lytic metastasis, sclerotic 7 mm left iliac bone lesion. PET-CT could be performed to evaluate for extent of disease.   Electronically Signed   By: Awilda Metro   On: 11/23/2012 05:20   X-ray Chest Pa And Lateral   11/23/2012   CLINICAL DATA:  Atrial fibrillation.  Chest pain.  EXAM: CHEST  2 VIEW  COMPARISON:  03/03/2004  FINDINGS: Low lung volumes are present, causing crowding of the pulmonary vasculature. Apical lordotic projection accentuates cardiac size. Mild cardiomegaly. Thoracic spondylosis is present. Peripheral density in the left mid chest.  IMPRESSION: 1. Peripheral density left mid chest. Although this may partially be due to superimposed bony structures, cannot exclude peripheral nodule or pneumonia. Repeat chest radiography or chest CT recommended for further workup. 2. Mild cardiomegaly. 3. Thoracic spondylosis. 4. Low lung volumes.   Electronically Signed   By: Herbie Baltimore M.D.   On: 11/23/2012 02:53      A/P: 62 y.o. male asked to see for evaluation of new found large left renal mass, with bilateral adrenal masses and pulmonary nodules, which likely are primary renal neoplasm with  metastasis. A PET/CT is scheduled.A tissue biopsy is planned for today.  Dr. Rosie Fate   is to see the patient following this consult with recommendations regarding diagnosis, treatment options and further workup studies. Urology is entertaining cytoreductive nephrectomy.to date, no tissue has been  obtained. I are a waking consultation by Oncology Thank you for the referral.  Marcos Eke, PA-C 11/24/2012 12:15 PM   ADDENDUM:  Patient is a 62 year-old AAM with history morbid obesity, hypertension (risk factors) found to have large left renal mass, bilateral adrenal masses, and pulmonary nodules suspicious for metastatic kidney cancer.  He is ECOG 2. He awaits biopsy for tissue diagnosis.  We prefer biopsy of a metastatic site.   If confirmed Stage IV kidney cancer, he has the following poor prognostic features including an elevated calcium (corrected for albumin of 3, 11.1), low hemoglobin of 10.2, greater than 2 sites (especially those outside the lung-only).  We did not check an LDH (LDH greater than 1.5 upper limit of normal is a poor prognostic sign).  Patients most likely to benefit from cytoreductive nephrectomy before systemic therapy are those with lung-only metastases, good prognostic features and a good performance. (Culp SH et. Al, Cancer 2010). I agree to evaluate with PET/CT to confirm EOD.  If greater than 2 metastatic sites, he would be unlikely to benefit from cytoreductive nephrectomy. However, patients with metastatic disease with hematuria or pain or other associated symptoms related to the primary tumor may benefit from palliative nephrectomy if determined to be surgical candidates. We discussed treatment options including sunitinib as a potential first-line for clear cell carcinoma or best supportive. We will await biopsy and he can follow up with [place] in the clinic to further discuss systemic chemotherapy treatment options.  Code status Full.  Patient's family aware of plan.  I spoke with  his sister Shawn Cole at [phone].    Please obtain LDH, biopsy for histology and staging (metastatic site preferred if possible) and PET/CT.  Follow up as an outpatient to discuss treatment options.    I personally saw this patient and performed a substantive portion of this encounter with the listed APP documented above.   Melyssa Signor, MD

## 2012-11-25 MED ORDER — DIGOXIN 125 MCG PO TABS
0.1250 mg | ORAL_TABLET | Freq: Every day | ORAL | Status: DC
  Administered 2012-11-25 – 2012-11-26 (×2): 0.125 mg via ORAL
  Filled 2012-11-25 (×2): qty 1

## 2012-11-25 MED ORDER — LEVOTHYROXINE SODIUM 25 MCG PO TABS: 25.0000 ug | ORAL_TABLET | Freq: Every day | ORAL | Status: DC

## 2012-11-25 MED ORDER — LEVOTHYROXINE SODIUM 25 MCG PO TABS
25.0000 ug | ORAL_TABLET | Freq: Every day | ORAL | Status: DC
  Administered 2012-11-25 – 2012-11-26 (×2): 25 ug via ORAL
  Filled 2012-11-25 (×2): qty 1

## 2012-11-25 MED ORDER — METOPROLOL TARTRATE 25 MG PO TABS
25.0000 mg | ORAL_TABLET | Freq: Two times a day (BID) | ORAL | Status: DC
  Administered 2012-11-25 – 2012-11-26 (×3): 25 mg via ORAL
  Filled 2012-11-25 (×4): qty 1

## 2012-11-25 NOTE — Progress Notes (Signed)
Ambulated in hallway, SOB noted and HR increased to 140's nonsustained. Pt assisted to room to chair , SOB subsided and HR in normal range, will continue to monitor.

## 2012-11-25 NOTE — Progress Notes (Signed)
Subjective:  Oral intake 50 %.   Objective:  Vital Signs in the last 24 hours: Temp:  [98.4 F (36.9 C)-98.8 F (37.1 C)] 98.7 F (37.1 C) (11/22 0613) Pulse Rate:  [86-108] 95 (11/22 0958) Cardiac Rhythm:  [-] Atrial fibrillation (11/21 2357) Resp:  [18-20] 20 (11/22 1610) BP: (95-127)/(57-70) 97/66 mmHg (11/22 0958) SpO2:  [94 %-98 %] 94 % (11/22 0613) Weight:  [97.1 kg (214 lb 1.1 oz)] 97.1 kg (214 lb 1.1 oz) (11/22 9604)  Physical Exam: BP Readings from Last 1 Encounters:  11/25/12 97/66     Wt Readings from Last 1 Encounters:  11/25/12 97.1 kg (214 lb 1.1 oz)    Weight change: 1.1 kg (2 lb 6.8 oz)  HEENT: [place]/AT, Eyes-Brown, PERL, EOMI, Conjunctiva-Pale pink, Sclera-Non-icteric Neck: No JVD, No bruit, Trachea midline. Lungs:  Clear, Bilateral. Cardiac:  Regular rhythm, normal S1 and S2, no S3.  Abdomen:  Soft, non-tender. Extremities:  No edema present. No cyanosis. No clubbing. CNS: AxOx3, Cranial nerves grossly intact, moves all 4 extremities. Right handed. Skin: Warm and dry.   Intake/Output from previous day: 11/21 0701 - 11/22 0700 In: 480 [P.O.:230; IV Piggyback:250] Out: 600 [Urine:600]    Lab Results: BMET    Component Value Date/Time   NA 130* 11/23/2012 0600   K 3.6 11/23/2012 0600   CL 90* 11/23/2012 0600   CO2 27 11/23/2012 0600   GLUCOSE 102* 11/23/2012 0600   BUN 7 11/23/2012 0600   CREATININE 0.73 11/23/2012 0600   CALCIUM 9.8 11/23/2012 0600   GFRNONAA >90 11/23/2012 0600   GFRAA >90 11/23/2012 0600   CBC    Component Value Date/Time   WBC 5.2 11/23/2012 0600   RBC 5.03 11/23/2012 0600   HGB 10.2* 11/23/2012 0600   HCT 33.8* 11/23/2012 0600   PLT 243 11/23/2012 0600   MCV 67.2* 11/23/2012 0600   MCH 20.3* 11/23/2012 0600   MCHC 30.2 11/23/2012 0600   RDW 16.8* 11/23/2012 0600   LYMPHSABS 1.8 11/22/2012 1430   MONOABS 0.5 11/22/2012 1430   EOSABS 0.1 11/22/2012 1430   BASOSABS 0.0 11/22/2012 1430   CARDIAC ENZYMES No results  found for this basename: CKTOTAL, CKMB, CKMBINDEX, TROPONINI    Scheduled Meds: . azithromycin  500 mg Intravenous Q24H  . docusate sodium  100 mg Oral BID  . feeding supplement (ENSURE COMPLETE)  237 mL Oral Q24H  . ferrous sulfate  325 mg Oral BID WC  . heparin  5,000 Units Subcutaneous Q8H  . sodium chloride  3 mL Intravenous Q12H   Continuous Infusions: . sodium chloride 30 mL/hr at 11/23/12 0931   PRN Meds:.acetaminophen, acetaminophen, ondansetron (ZOFRAN) IV, ondansetron  Assessment/Plan: Dehydration-improving  Hypothyroidism  Obesity  Abdominal pain  Weight loss with loss of appetite  Hyponatremia  Anemia-r/o iron deficiency  Left renal mass  Pulmonary nodules  Increase activity. Home in AM.   LOS: 3 days    Orpah Cobb  MD  11/25/2012, 10:31 AM     Oral intake 50 %

## 2012-11-25 NOTE — Progress Notes (Signed)
Dr. Algie Coffer called related to elevaterd HR, new orders given and followed through. Pt remains alert and oriented times 4, NAD noted, will continue to monitor

## 2012-11-26 MED ORDER — DSS 100 MG PO CAPS: 100.0000 mg | ORAL_CAPSULE | Freq: Two times a day (BID) | ORAL | Status: AC

## 2012-11-26 MED ORDER — FERROUS SULFATE 325 (65 FE) MG PO TABS: 325.0000 mg | ORAL_TABLET | Freq: Two times a day (BID) | ORAL | Status: AC

## 2012-11-26 MED ORDER — AZITHROMYCIN 500 MG PO TABS
500.0000 mg | ORAL_TABLET | ORAL | Status: DC
  Filled 2012-11-26: qty 1

## 2012-11-26 MED ORDER — DIGOXIN 125 MCG PO TABS: 0.1250 mg | ORAL_TABLET | Freq: Every day | ORAL | Status: DC

## 2012-11-26 MED ORDER — ENSURE COMPLETE PO LIQD: 237.0000 mL | ORAL | Status: AC

## 2012-11-26 MED ORDER — OXYCODONE HCL 5 MG PO TABS
5.0000 mg | ORAL_TABLET | Freq: Four times a day (QID) | ORAL | Status: DC | PRN
  Administered 2012-11-26: 5 mg via ORAL
  Filled 2012-11-26: qty 1

## 2012-11-26 MED ORDER — METOPROLOL TARTRATE 25 MG PO TABS: 100.0000 mg | ORAL_TABLET | Freq: Two times a day (BID) | ORAL | Status: DC

## 2012-11-26 NOTE — Progress Notes (Signed)
Chaplain received referral from CSW for Advance Directive. Chaplain visited with pt, who was sitting on side of bed. Pt had AD document and said that it was his sisters who wanted the HPOA, but he had no interest in it. Chaplain was present to answer questions about the AD and told pt to let his RNs know if he changes his mind. Pt expressed anxiety over health changes and prospect of surgery, but described a vibrant faith in an "awesome God." Chaplain provided emotional/spiritual support, caring presence, and reflective listening.   Maurene Capes [phone]

## 2012-11-26 NOTE — Discharge Summary (Signed)
Physician Discharge Summary  Patient ID: Shawn Cole MRN: [id number] DOB/AGE: 61-Mar-1952 61 y.o.  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 He had CT scan of abdomen and pelvis showing large left renal mass with metastasis. He had Urology and Oncology consults who will continue follow up and OP work-up including PET scan and cytoreductive surgery.  Patient was advised to apply for medicaid as he will have significant expenses for treatment of his renal tumor.  He was also advised to eat small four meals and drink Ensure supplement as tolerated along with iron pills and pain pills. His antihypertensive medications have seen discontinued due to weight loss and hypotension. He responded to zithromax for his upper respiratory tract infection.  Consults: hematology/oncology and urology  Significant Diagnostic Studies: labs: Normal WBC and Platelets count. Low Hgb of 10.2 and low iron level of 14 Mcg/dl with only 7 % saturation. Near normal CMET except sodium of 130 to 134 meq. And albumin of 3.0.  EKG-Atrial fibrillation.  CT abdomen and Pelvis without contrast: At least 12.7 x 11.3 x 11.5 cm left upper pole renal mass, difficult to further characterize on this noncontrast examination, highly concerning for primary renal neoplasm.  5.2 cm left adrenal mass, 11 mm  right adrenal mass in addition to multiple pulmonary nodules /masses, largest in the right middle lobe measuring 3 x 4.2 cm, highly concerning for metastatic disease. Patient is osteopenic with lucencies in the sacrum may reflect focal osteopenia, less likely lytic metastasis, sclerotic 7 mm left iliac bone lesion. PET-CT could be performed to evaluate for extent of disease.   Treatments: IV hydration and antibiotics: azithromycin  Discharge Exam: Blood pressure 107/67, pulse 94, temperature 98.1 F (36.7 C), temperature source Oral, resp. rate 20, height 5\' 9"  (1.753 m), weight 98 kg (216 lb 0.8 oz), SpO2 100.00%. HEENT: [place]/AT, Eyes-Brown, PERL, EOMI, Conjunctiva-Pale pink, Sclera-Non-icteric  Neck: No JVD, No bruit, Trachea midline.  Lungs: Clear, Bilateral.  Cardiac: Regular rhythm, normal S1 and S2, no S3.  Abdomen: Soft, non-tender.  Extremities: No edema present. No cyanosis. No clubbing.  CNS: AxOx3, Cranial nerves grossly intact, moves all 4 extremities. Right handed.  Skin: Warm and dry.   Disposition: 01-Home or Self Care     Medication List    STOP taking these medications       amLODipine 10 MG tablet  Commonly known as:  NORVASC      TAKE these medications       aspirin 81 MG tablet  Take 81 mg by mouth daily.     atorvastatin 10 MG tablet  Commonly known as:  LIPITOR  Take 10 mg by mouth daily.     digoxin 0.125 MG tablet  Commonly known as:  LANOXIN  Take 1 tablet (0.125 mg total) by mouth daily.     DSS 100 MG Caps  Take 100 mg by mouth 2 (two) times daily.     feeding supplement (  ENSURE COMPLETE) Liqd  Take 237 mLs by mouth daily.     ferrous sulfate 325 (65 FE) MG tablet  Take 1 tablet (325 mg total) by mouth 2 (two) times daily with a meal.     levothyroxine 25 MCG tablet  Commonly known as:  SYNTHROID, LEVOTHROID  Take 25 mcg by mouth daily before breakfast.     metoprolol tartrate 25 MG tablet  Commonly known as:  LOPRESSOR  Take 4 tablets  (100 mg total) by mouth 2 (two) times daily.     oxyCODONE-acetaminophen 5-325 MG per tablet  Commonly known as:  PERCOCET/ROXICET  One or two tablets every 4 to 6 hours as needed for pain           Follow-up Information   Follow up with NESI,MARC-HENRY, MD. Schedule an appointment as soon as possible for a visit in 1 week.   Specialty:  Urology   Contact information:   [address] [place] [address] [phone]       Follow up with CHISM, DAVID, MD. Schedule an appointment as soon as possible for a visit in 1 week.   Specialty:  Internal Medicine   Contact information:   [address] [place] [address] [phone]       Follow up with [facility], MD. Schedule an appointment as soon as possible for a visit in 2 weeks.   Specialty:  Cardiology   Contact information:   [address] [place] [address] [phone]       Signed: Ricki Rodriguez 11/26/2012, 2:37 PM

## 2012-11-26 NOTE — Progress Notes (Signed)
Discharge instructions along with prescriptions were given to patient.  Verbalized understanding of discharge instructions.

## 2012-11-27 ENCOUNTER — Telehealth: Payer: Self-pay

## 2012-11-27 NOTE — Telephone Encounter (Signed)
LEFT PT VM TO RETURN CALL IN REF TO HOSP. F/U APPT °

## 2012-11-28 ENCOUNTER — Other Ambulatory Visit (HOSPITAL_COMMUNITY): Payer: Self-pay | Admitting: Urology

## 2012-11-28 ENCOUNTER — Telehealth: Payer: Self-pay | Admitting: Internal Medicine

## 2012-11-28 DIAGNOSIS — C649 Malignant neoplasm of unspecified kidney, except renal pelvis: Secondary | ICD-10-CM

## 2012-11-28 NOTE — Telephone Encounter (Signed)
I spoke with his sister Shawn Cole at [phone].  Patient is not at home (number listed in demographic section).  He will call with an updated number to establish his follow-up.  Patient has not made a decision regarding surgery.  He was asking more questions regarding "what it does not want any treatment" per his sister.   He will continue to discuss with his family over the holidays.   I re-iterated that he should proceed with surgery if he is determined to be a candidate or systemic chemotherapy or we can help with palliative/ supportive care.   He will call to up date his number today.

## 2012-12-04 ENCOUNTER — Telehealth: Payer: Self-pay

## 2012-12-04 NOTE — Telephone Encounter (Signed)
Pt called to schedule hosp f/u on 12/08/12@3 :00

## 2012-12-06 ENCOUNTER — Other Ambulatory Visit: Payer: Self-pay | Admitting: Internal Medicine

## 2012-12-06 DIAGNOSIS — C642 Malignant neoplasm of left kidney, except renal pelvis: Secondary | ICD-10-CM

## 2012-12-08 ENCOUNTER — Encounter: Payer: Self-pay | Admitting: Internal Medicine

## 2012-12-08 ENCOUNTER — Other Ambulatory Visit: Payer: Self-pay | Admitting: Internal Medicine

## 2012-12-08 ENCOUNTER — Ambulatory Visit: Payer: Medicare Other

## 2012-12-08 ENCOUNTER — Ambulatory Visit (HOSPITAL_BASED_OUTPATIENT_CLINIC_OR_DEPARTMENT_OTHER): Payer: Medicare Other | Admitting: Internal Medicine

## 2012-12-08 ENCOUNTER — Other Ambulatory Visit (HOSPITAL_BASED_OUTPATIENT_CLINIC_OR_DEPARTMENT_OTHER): Payer: Medicare Other | Admitting: Lab

## 2012-12-08 VITALS — BP 121/80 | HR 100 | Temp 98.2°F | Resp 18 | Ht 69.0 in | Wt 213.1 lb

## 2012-12-08 DIAGNOSIS — C642 Malignant neoplasm of left kidney, except renal pelvis: Secondary | ICD-10-CM | POA: Insufficient documentation

## 2012-12-08 DIAGNOSIS — G893 Neoplasm related pain (acute) (chronic): Secondary | ICD-10-CM

## 2012-12-08 DIAGNOSIS — E8809 Other disorders of plasma-protein metabolism, not elsewhere classified: Secondary | ICD-10-CM

## 2012-12-08 DIAGNOSIS — E279 Disorder of adrenal gland, unspecified: Secondary | ICD-10-CM

## 2012-12-08 DIAGNOSIS — M899 Disorder of bone, unspecified: Secondary | ICD-10-CM

## 2012-12-08 DIAGNOSIS — R634 Abnormal weight loss: Secondary | ICD-10-CM

## 2012-12-08 DIAGNOSIS — C801 Malignant (primary) neoplasm, unspecified: Secondary | ICD-10-CM

## 2012-12-08 DIAGNOSIS — C7970 Secondary malignant neoplasm of unspecified adrenal gland: Secondary | ICD-10-CM

## 2012-12-08 DIAGNOSIS — I4891 Unspecified atrial fibrillation: Secondary | ICD-10-CM

## 2012-12-08 DIAGNOSIS — E785 Hyperlipidemia, unspecified: Secondary | ICD-10-CM

## 2012-12-08 DIAGNOSIS — D509 Iron deficiency anemia, unspecified: Secondary | ICD-10-CM

## 2012-12-08 DIAGNOSIS — C649 Malignant neoplasm of unspecified kidney, except renal pelvis: Secondary | ICD-10-CM

## 2012-12-08 DIAGNOSIS — D630 Anemia in neoplastic disease: Secondary | ICD-10-CM

## 2012-12-08 DIAGNOSIS — I10 Essential (primary) hypertension: Secondary | ICD-10-CM

## 2012-12-08 DIAGNOSIS — R918 Other nonspecific abnormal finding of lung field: Secondary | ICD-10-CM

## 2012-12-08 DIAGNOSIS — R630 Anorexia: Secondary | ICD-10-CM

## 2012-12-08 DIAGNOSIS — C7800 Secondary malignant neoplasm of unspecified lung: Secondary | ICD-10-CM

## 2012-12-08 LAB — COMPREHENSIVE METABOLIC PANEL (CC13)
ALT: 17 U/L (ref 0–55)
AST: 17 U/L (ref 5–34)
Albumin: 2.5 g/dL — ABNORMAL LOW (ref 3.5–5.0)
Anion Gap: 12 mEq/L — ABNORMAL HIGH (ref 3–11)
CO2: 26 mEq/L (ref 22–29)
Calcium: 10.5 mg/dL — ABNORMAL HIGH (ref 8.4–10.4)
Chloride: 97 mEq/L — ABNORMAL LOW (ref 98–109)
Glucose: 99 mg/dl (ref 70–140)
Potassium: 4.5 mEq/L (ref 3.5–5.1)
Sodium: 134 mEq/L — ABNORMAL LOW (ref 136–145)
Total Bilirubin: 0.68 mg/dL (ref 0.20–1.20)
Total Protein: 8.5 g/dL — ABNORMAL HIGH (ref 6.4–8.3)

## 2012-12-08 LAB — CBC WITH DIFFERENTIAL/PLATELET
BASO%: 0.2 % (ref 0.0–2.0)
Basophils Absolute: 0 10*3/uL (ref 0.0–0.1)
Eosinophils Absolute: 0.1 10*3/uL (ref 0.0–0.5)
HGB: 10.2 g/dL — ABNORMAL LOW (ref 13.0–17.1)
MCH: 20.2 pg — ABNORMAL LOW (ref 27.2–33.4)
MONO#: 0.5 10*3/uL (ref 0.1–0.9)
MONO%: 9 % (ref 0.0–14.0)
NEUT#: 3.8 10*3/uL (ref 1.5–6.5)
Platelets: 313 10*3/uL (ref 140–400)
RBC: 5.06 10*6/uL (ref 4.20–5.82)
RDW: 17.2 % — ABNORMAL HIGH (ref 11.0–14.6)
WBC: 5.9 10*3/uL (ref 4.0–10.3)
nRBC: 0 % (ref 0–0)

## 2012-12-08 NOTE — Patient Instructions (Signed)
Hospice Care  Hospice is a care service which can be used by people who are terminally ill and in whom healing is no longer thought possible. Hospice care is for people believed to have no more than 6 months to live. It is meant to help with the two largest fears near the end of life (the fears of dying and of being alone), as well as pain management, and an attempt to allow people to pass away comfortably at home.  Hospice staff:  Administer appropriate pain relief.  Provide nursing care.  Offer reassurance and support to loved ones and family members.  Provide services to keep people comfortable at home or in a hospice facility. Together, you can see to it that your loved one is not alone during this last and important phase of life. You, your family, and your caregivers help you decide when hospice services should begin. If your condition improves or the disease goes into remission, you can be discharged from the hospice program. You can return to hospice care at a later time if needed. The hospice philosophy recognizes death as the final stage of life. It helps patients continue an alert, pain-free life, and manage symptoms while surrounded by their loved ones. Hospice affirms life without hurrying death. Hospice care treats the person rather than the disease. It emphasizes quality of life with family-centered care. Hospice care involves the patient and family and helps them make decisions.  The care is designed to:  Relieve or decrease pain.  Control other problems.  Provide as much quality time as possible.  Allow people to die with dignity. Unlike other medical care, the focus is no longer on curing disease. The goal of hospice care is to offer as high a quality of life as possible during the end of life. In this way, the last days of life may be spent with dignity.  With hospice care, instead of spending the last weeks or months in a hospital, a person is with loved ones in the home  or a homelike setting. About 90 percent of hospice care is provided at home. But hospice is available wherever a person lives, including a nursing home or assisted-living residence. Some residential hospices designed specifically for hospice care also exist. Hospice care is available for many types of terminal illnesses. Hospice services are meant to serve both the patient and family members.  Comfort. In most cases, the individual stays in his or her home or in homelike surroundings instead of in a hospital. The core of hospice is a cooperative effort by family, friends and a team of professional and volunteer caregivers working together to meet your loved one's needs. This team supplies all necessary medicines and equipment. It works with both the person involved and family members to relieve pain and symptoms.  Support. Individuals enjoy the support of loved ones by receiving much of the basic care from family and friends. A nurse may lead the team and coordinates the day-to-day care. A doctor is also part of the team. Chaplains and social workers are available to counsel the family and their loved one. They make sure emotional, spiritual, and social needs are being met. Trained volunteers perform a wide variety of tasks as needed, such as:  Providing companionship.  Doing light housekeeping.  Preparing meals.  Running errands.  Providing respite for the family.  Improving quality of life. Caring for someone who is dying is emotionally and physically demanding. This can be particularly true for family members  who are primary caregivers. But you can take comfort in knowing that hospice is an act of love that can improve the quality of life for all involved. Professionals are often available to tend to the needs of grieving family members as well.  Spiritual Care. Hospice care emphasizes the spiritual needs of you and your family. People differ in their spiritual needs and religious beliefs so  spiritual care is individualized to meet the persons' and family's needs. It may include helping you to look at what death means to you, to say good-bye, or to perform a specific religious ceremony or ritual. HOW TO SELECT A PROGRAM Most hospice programs are run by nonprofit, independent organizations. Some are affiliated with hospitals, nursing homes or home health care agencies. Some are for-profit organizations. You can learn about existing hospice programs in your area from your health caregivers. ASK THE FOLLOWING:  What services are available to the patient?  What services are offered to the family?  Are bereavement services available?  How involved are the family members?  How involved is the doctor?  Who makes up the hospice care team? How are they trained or screened?  How will the individual's pain and symptoms be managed?  If circumstances change, can services be provided in different settings, such as the home or the hospital?  Is the program reviewed and licensed by the state or certified in some other way?  Are all costs covered by insurance? How much you pay for hospice care can vary greatly. It depends on the length and type of care necessary and your insurance coverage. Medicare and most private insurance plans, including managed care organizations, cover hospice care. Hospice is also covered by Medicaid in most states. Some hospice programs provide services on a sliding fee scale, based on your ability to pay. They may also provide some durable medical equipment for support within the home. Document Released: 04/09/2003 Document Revised: 03/15/2011 Document Reviewed: 12/21/2004 [facility] Patient Information 2014 [place], [place]. Renal Cell Cancer Renal cell cancer (kidney cancer) is a disease in which cancer cells form in the linings of tubules of the kidney. A cancer is an uncontrolled growth of cells and can occur anywhere in the body. Your kidneys are the organs which  filter your blood and keep it clean by getting rid of waste products from your body in your urine. Urine passes from the kidneys into the bladder through long tubes called ureters. The bladder stores the urine until it is passed from the body through the tube which drains the bladder to the outside (urethra). SYMPTOMS  Early in the disease there may be no problems but as the disease worsens some of the problems seen are:  Blood in the urine.  Belly (abdominal) pain.  Decreased red blood cells (anemia).  A swelling in the belly.  Loss of appetite and weight loss.  Fever from unknown causes. DIAGNOSIS   Your caregiver will do a physical exam. This means they check you over.  Laboratory work may show problems (abnormalities) in the urine.  Plain X-rays and some specialized x-rays may be done. Some of these may include a CT scan. Sometimes an IVP (intravenous pyelogram) is done. In this test a dye is injected into a vein and pictures are then taken of the kidneys. The dye travels to the inside of the kidneys, ureters and bladder. Let your caregivers know if you are allergic to iodine or have had a past reaction to dyes used in X-rays. Other specialized  x-rays sometimes taken are the MRI (magnetic resonance imaging) and PET scan (positron emission technology).  Angiography is sometimes done in which a dye is put into an artery leading to the kidney so the vessels surrounding the tumor or growth can be studied.  Your caregiver will explain the value of the various testing to you and why it is necessary and helpful. If some of the above tests show a tumor or growth, sometimes a needle biopsy is done to confirm this and find out what the growth is made of. A fine needle aspiration (FNA) is used to remove a sliver of tissue from the kidney. This is done by sticking a needle through your skin and into the kidney. A specialist in looking at cells under the microscope (pathologist) then looks at the  biopsy to determine what is wrong. The pathologist will check for cancer cells. Usually the previous tests mentioned have already given your surgeon enough information to know if an operation is needed. TREATMENT  You will want to discuss treatment choices with your caregivers and see what the best treatment for you is. This will depend on various factors including your age, other health problems and what stage your disease is in. All of this will play a part in your outcome. Some of the treatment choices are:  Surgery is the main treatment and chances of surviving without this are uncommon. Usually the entire kidney is removed if this is possible. This is called a radical nephrectomy. The surgeon removes your kidney, the small gland on top of the kidney (adrenal gland) and the fat surrounding the kidney. You have another adrenal gland on the other side so removing one is not a problem. Sometimes a partial nephrectomy is done in people with one kidney or people with cancer on both sides. This may help to avoid use of an artificial kidney (dialysis) as only part of a kidney is needed to filter your blood.  Arterial embolization is another treatment. With this treatment a small catheter is threaded from your groin up into your kidney and material is injected into the artery supplying the tumor in the kidney. Without blood supply, the tumor dies off. Sometimes this procedure is used before kidney removal to cut down on blood loss.  Radiation therapy or x-ray therapy can be used if your health is poor and will not allow surgery. Some of the problems with radiation include fatigue, nausea, vomiting, and damage to skin and surrounding tissues.  Chemotherapy may be used. This is a treatment which uses cancer killing medications to fight the cancer. The side effects depend on the medications used. Some side effects may include nausea, vomiting, loss of weight, loss of appetite, hair loss and other problems. Your  caregiver can usually give you medications to overcome most of the problems.  There are many other forms of treatment your caregivers can discuss with you. Together you can determine which treatment will be best for you.  The more you know when dealing with these problems, the more comfortable you will be. Talk over your treatment over with your loved ones. Get a second opinion if you feel it will be of help. Often your surgeon and other caregivers may recommend this for your own comfort or peace of mind. Document Released: 11/01/2003 Document Revised: 03/15/2011 Document Reviewed: 10/12/2007 [facility] Patient Information 2014 [place], [place]. Metastatic Cancer, Questions and Answers KEY POINTS  Cancer happens when cells become abnormal and grow without control.  Where the cancer started  is called the primary cancer or the primary tumor.  Metastatic cancer happens when cancer cells spread from the place where it started to other parts of the body.  When cancer spreads, the metastatic cancer keeps the same type of cells and the same name as the primary tumor.  The most common sites of metastasis are the lungs, bones, liver, and brain.  Treatment for metastatic cancer usually depends on the type of cancer. It also depends on the size and location of the metastasis. WHAT IS CANCER?   Cancer is a group of many related diseases. All cancers begin in cells. Cells are the building blocks that make up tissues. Cancer that arises from organs and solid tissues is called a solid tumor. Cancer that begins in blood cells is called leukemia, multiple myeloma, or lymphoma.  Normally, cells grow and divide to form new cells as the body needs them. When cells grow old and die, new cells take their place. Sometimes this orderly process goes wrong. New cells form when the body does not need them. Old cells do not die when they should.  The extra cells form a mass of tissue. This is called a growth or tumor.  Tumors can be either not cancerous (benign) or cancerous (malignant). Benign tumors do not spread to other parts of the body. They are rarely a threat to life. Malignant tumors can spread (metastasize) and may be life threatening. WHAT IS PRIMARY CANCER?  Cancer can begin in any organ or tissue of the body. The original tumor is called the primary cancer or primary tumor. It is usually named for the part of the body or the type of cell in which it begins. WHAT IS METASTASIS, AND HOW DOES IT HAPPEN?   Metastasis means the spread of cancer. Cancer cells can break away from a primary tumor and enter the bloodstream or lymphatic system. This is the system that produces, stores, and carries the cells that fight infections. That is how cancer cells spread to other parts of the body.  When cancer cells spread and form a new tumor in a different organ, the new tumor is a metastatic tumor. The cells in the metastatic tumor come from the original tumor. For example, if breast cancer spreads to the lungs, the metastatic tumor in the lung is made up of cancerous breast cells. It is not made of lung cells. In this case, the disease in the lungs is metastatic breast cancer (not lung cancer). Under a microscope, metastatic breast cancer cells generally look the same as the cancer cells in the breast. WHERE DOES CANCER SPREAD?   Cancer cells can spread to almost any part of the body. Cancer cells frequently spread to lymph nodes (rounded masses of lymphatic tissue) near the primary tumor (regional lymph nodes). This is called lymph node involvement or regional disease. Cancer that spreads to other organs or to lymph nodes far from the primary tumor is called metastatic disease. Caregivers sometimes also call this distant disease.  The most common sites of metastasis from solid tumors are the lungs, bones, liver, and brain. Some cancers tend to spread to certain parts of the body. For example, lung cancer often  metastasizes to the brain or bones. Colon cancer often spreads to the liver. Prostate cancer tends to spread to the bones. Breast cancer commonly spreads to the bones, lungs, liver, or brain. But each of these cancers can spread to other parts of the body as well.  Because blood cells travel  throughout the body, leukemia, multiple myeloma, and lymphoma cells are usually not localized when the cancer is diagnosed. Tumor cells may be found in the blood, several lymph nodes, or other parts of the body such as the liver or bones. This type of spread is not referred to as metastasis. ARE THERE SYMPTOMS OF METASTATIC CANCER?   Some people with metastatic cancer do not have symptoms. Their metastases are found by X-rays and other tests performed for other reasons.  When symptoms of metastatic cancer occur, the type and frequency of the symptoms will depend on the size and location of the metastasis. For example, cancer that spreads to the bones is likely to cause pain and can lead to bone fractures. Cancer that spreads to the brain can cause a variety of symptoms. These include headaches, seizures, and unsteadiness. Shortness of breath may be a sign of lung involvement. Abdominal swelling or yellowing of the skin (jaundice) can indicate that cancer has spread to the liver.  Sometimes a person's primary cancer is discovered only after the metastatic tumor causes symptoms. For example, a man whose prostate cancer has spread to the bones in his pelvis may have lower back pain (caused by the cancer in his bones) before he experiences any symptoms from the primary tumor in his prostate. HOW DOES THE CAREGIVER KNOW WHETHER A CANCER IS PRIMARY OR A METASTATIC TUMOR?  To determine whether a tumor is primary or metastatic, the tumor will be examined under a microscope. In general, cancer cells look like abnormal versions of cells in the tissue where the cancer began. Using specialized diagnostic tests, a trained person  is often able to tell where the cancer cells came from. Markers or antigens found in or on the cancer cells can indicate the primary site of the cancer.  Metastatic cancers may be found before or at the same time as the primary tumor, or months or years later. When a new tumor is found in a patient who has been treated for cancer in the past, it is more often a metastasis than another primary tumor. IS IT POSSIBLE TO HAVE A METASTATIC TUMOR WITHOUT HAVING A PRIMARY CANCER?  No. A metastatic tumor always starts from cancer cells in another part of the body. In most cases, when a metastatic tumor is found first, the primary tumor can be found. The search for the primary tumor may involve lab tests, X-rays, and other procedures. However, in a small number of cases, a metastatic tumor is diagnosed but the primary tumor cannot be found, in spite of extensive tests. The tumor is metastatic because the cells are not like those in the organ or tissue in which the tumor is found. The primary tumor is called unknown or hidden (occult). The patient is said to have cancer of unknown primary origin (CUP). Because diagnostic techniques are constantly improving, the number of cases of CUP is going down.  WHAT TREATMENTS ARE USED FOR METASTATIC CANCER?   When cancer has metastasized, it may be treated with:  Chemotherapy.  Radiation therapy.  Biological therapy.  Hormone therapy.  Surgery.  Cryosurgery.  A combination of these.  The choice of treatment generally depends on the:  Type of primary cancer.  Size and location of the metastasis.  Patient's age and general health.  Types of treatments the patient has had in the past. In patients with CUP, it is possible to treat the disease even though the primary tumor has not been located. The goal of treatment  may be to control the cancer, or to relieve symptoms or side effects of treatment. ARE NEW TREATMENTS FOR METASTATIC CANCER BEING DEVELOPED?   Yes, many new cancer treatments are under study. To develop new treatments, the NCI sponsors clinical trials (research studies) with cancer patients in many hospitals, universities, medical schools, and cancer centers around the country. Clinical trials are a critical step in the improvement of treatment. Before any new treatment can be recommended for general use, doctors conduct studies to find out whether the treatment is both safe for patients and effective against the disease. The results of such studies have led to progress not only in the treatment of cancer, but in the detection, diagnosis, and prevention of the disease as well. Patients interested in taking part in a clinical trial should talk with their caregivers. FOR MORE INFORMATION National Cancer Institute (NCI): www.cancer.gov Document Released: 04/27/2004 Document Revised: 03/15/2011 Document Reviewed: 12/14/2007 [facility] Patient Information 2014 [place], [place].

## 2012-12-10 DIAGNOSIS — D630 Anemia in neoplastic disease: Secondary | ICD-10-CM | POA: Insufficient documentation

## 2012-12-10 DIAGNOSIS — G893 Neoplasm related pain (acute) (chronic): Secondary | ICD-10-CM | POA: Insufficient documentation

## 2012-12-10 DIAGNOSIS — E785 Hyperlipidemia, unspecified: Secondary | ICD-10-CM | POA: Insufficient documentation

## 2012-12-10 DIAGNOSIS — R630 Anorexia: Secondary | ICD-10-CM | POA: Insufficient documentation

## 2012-12-10 DIAGNOSIS — I4891 Unspecified atrial fibrillation: Secondary | ICD-10-CM | POA: Insufficient documentation

## 2012-12-10 DIAGNOSIS — I10 Essential (primary) hypertension: Secondary | ICD-10-CM | POA: Insufficient documentation

## 2012-12-10 DIAGNOSIS — C7800 Secondary malignant neoplasm of unspecified lung: Secondary | ICD-10-CM | POA: Insufficient documentation

## 2012-12-10 DIAGNOSIS — C7970 Secondary malignant neoplasm of unspecified adrenal gland: Secondary | ICD-10-CM | POA: Insufficient documentation

## 2012-12-10 NOTE — Progress Notes (Signed)
[facility] OFFICE PROGRESS NOTE  Ricki Rodriguez, MD [address] 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 Probable Metastatic renal cancer   CURRENT THERAPY: Best supportive care.   INTERVAL HISTORY: Shawn Cole 62 y.o. male  with history morbid obesity, hypertension (risk factors) found to have large left renal mass, bilateral adrenal masses, and pulmonary nodules suspicious for metastatic kidney cancer. He was admitted to [facility] on 11/22/2012  from his PCPs office after presenting with several weeks of poor oral intake, weight loss, and diffuse abdominal discomfort. CT of the abdomen and pelvis without contrast on 11/23/2012 revealed a 12.7 x 11.3 x 11.5 cm left upper pole renal mass, with minimal perirenal stranding, the kidney was mildly displaced inferiorly due to the mass. There was no hydronephrosis or nephrolithiasis. The right kidney was unremarkable. There was a left adrenal 4.7 x 5.2 cm mass and a right adrenal 11 mm mass also noted. Small retroperitoneal lymph nodes (which were not pathologically enlarged by size criteria) were seen. Prostate was 5.1 cm and mildly invading the base of the urinary bladder. A 7 mm sclerotic lesion at left iliac bone was seen in the setting of osteopenia. Multiple remote apparent anterior rib fractures.  His symptoms began around 4 weeks ago, along with excessive sputum production without hemoptysis or cough. The excessive mucous production causes nausea.  He denied a history of voiding or urinary retention. No hematuria, no other symptoms  were reported by the patient.  We were requested to see the patient with recommendations for treatment planning in order to determine if cytoreductive nephrectomy was of benefit to the patient on 11/24/2012.  He was also evaluated by radiology and urology.  He was discharged on 11/26/2012.  He was scheduled for follow-up with medical oncology and urology for further evaluation and management.    Today, he is accompanied by his sister Jasmine December.  He reports lost of energy and appetite.  He is on the chair or bed greater than 50% of day.  He lives along with relatives visiting him to check on him.  He states that he does not want surgery presently and he is unsure of chemotherapy.   He requests more information about palliative care and hospice.   He states his pain is fairly controlled.  He is concerned about the constant mucous production despite completing the zithromax for his upper respiratory tract infection.  Otherwise, he denies fevers or chills or hematuria presently.  He is scheduled for a PET/CT on 12/14/2012.    MEDICAL HISTORY: Past Medical History  Diagnosis Date  . Hypertension   . Hypothyroidism   . Hyperlipidemia   . 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    ALLERGIES:  is allergic to contrast media.  MEDICATIONS: has a current medication list which includes the following prescription(s): aspirin, atorvastatin, digoxin, dss, feeding supplement (ensure complete), ferrous sulfate, levothyroxine, metoprolol tartrate, and oxycodone-acetaminophen.  SURGICAL HISTORY:  Past Surgical History  Procedure Laterality Date  . Irrigation and debridement abscess Left 05/04/2012    Procedure: MINOR INCISION AND DRAINAGE OF ABSCESS;   Surgeon: Wyn Forster., MD;  Location: [facility];  Service: Orthopedics;  Laterality: Left;    REVIEW OF SYSTEMS:   Constitutional: Denies fevers, chills or abnormal weight loss Eyes: Denies blurriness of vision Ears, nose, mouth, throat, and face: Denies mucositis or sore throat Respiratory: Denies cough, dyspnea or wheezes Cardiovascular: Denies palpitation, chest discomfort or lower extremity swelling Gastrointestinal:  Denies nausea, heartburn or change in bowel habits Skin: Denies abnormal skin rashes Lymphatics: Denies new lymphadenopathy or easy bruising Neurological:Denies numbness, tingling or new weaknesses Behavioral/Psych: Mood is stable, no new changes  All other systems were reviewed with the patient and are negative.  PHYSICAL EXAMINATION: ECOG PERFORMANCE STATUS: 3 - Symptomatic, >50% confined to bed  Blood pressure 121/80, pulse 100, temperature 98.2 F (36.8 C), temperature source Oral, resp. rate 18, height 5\' 9"  (1.753 m), weight 213 lb 1.6 oz (96.662 kg), SpO2 97.00%.  GENERAL:alert, no distress and comfortable; chronically ill appearing, sitting in wheelchair SKIN: skin color, texture, turgor are normal, no rashes or significant lesions EYES: normal, Conjunctiva are pink and non-injected, sclera clear OROPHARYNX:no exudate, no erythema and lips, buccal mucosa, and tongue normal ; poor dentition.  NECK: supple, thyroid normal size, non-tender, without nodularity LYMPH:  no palpable lymphadenopathy in the cervical, axillary or supraclavicular LUNGS: clear to auscultation and percussion with normal breathing effort HEART: Irregular irregular and no murmurs and no lower extremity edema ABDOMEN:abdomen soft, non-tender and normal bowel sounds Musculoskeletal:no cyanosis of digits and no clubbing  NEURO: alert & oriented x 3 with fluent speech, no focal motor/sensory deficits  Labs:  Lab Results  Component Value Date   WBC 5.9 12/08/2012    HGB 10.2* 12/08/2012   HCT 34.1* 12/08/2012   MCV 67.4* 12/08/2012   PLT 313 12/08/2012   NEUTROABS 3.8 12/08/2012      Chemistry      Component Value Date/Time   NA 134* 12/08/2012 1549   NA 130* 11/23/2012 0600   K 4.5 12/08/2012 1549   K 3.6 11/23/2012 0600   CL 90* 11/23/2012 0600   CO2 26 12/08/2012 1549   CO2 27 11/23/2012 0600   BUN 7.6 12/08/2012 1549   BUN 7 11/23/2012 0600   CREATININE 0.8 12/08/2012 1549   CREATININE 0.73 11/23/2012 0600      Component Value Date/Time   CALCIUM 10.5* 12/08/2012 1549   CALCIUM 9.8 11/23/2012 0600   ALKPHOS 78 12/08/2012 1549   ALKPHOS 64 11/22/2012 1430   AST 17 12/08/2012 1549   AST 10 11/22/2012 1430   ALT 17 12/08/2012 1549   ALT 6 11/22/2012 1430   BILITOT 0.68 12/08/2012 1549   BILITOT 0.6 11/22/2012 1430     Basic Metabolic Panel:  Recent Labs Lab 12/08/12 1549  NA 134*  K 4.5  CO2 26  GLUCOSE 99  BUN 7.6  CREATININE 0.8  CALCIUM 10.5*   GFR Estimated Creatinine Clearance: 111.2 ml/min (by C-G formula based on Cr of 0.8). Liver Function Tests:  Recent Labs Lab 12/08/12 1549  AST 17  ALT 17  ALKPHOS 78  BILITOT 0.68  PROT 8.5*  ALBUMIN 2.5*   CBC:  Recent Labs Lab 12/08/12 1549  WBC 5.9  NEUTROABS 3.8  HGB 10.2*  HCT 34.1*  MCV 67.4*  PLT 313   Studies:  No results found.  Results  for KEMP, GOMES (MRN [id number]) as of 12/10/2012 21:30  Ref. Range 12/08/2012 15:50  LDH Latest Range: 125-245 U/L 379 (H)    RADIOGRAPHIC STUDIES: Ct Abdomen Pelvis Wo Contrast  11/23/2012   CLINICAL DATA:  Abdominal pain and weight loss.  EXAM: CT ABDOMEN AND PELVIS WITHOUT CONTRAST  TECHNIQUE: Multidetector CT imaging of the abdomen and pelvis was performed following the standard protocol without intravenous contrast.  COMPARISON:  Acute abdominal series November 20, 2012  FINDINGS: Included view of the lung bases demonstrates multiple pulmonary nodules and masses, including right middle lobe 3 x 4.2 cm mass, right  lower lobe 1.7 x 1.1 cm mass, at least for pleural-based nodules along the diaphragm largest measuring 13 mm. 8 mm left lower lobe ground-glass nodule. Included heart appears mildly enlarged, pericardium is nonsuspicious.  12.7 x 11.3 x 11.5 cm left upper pole renal mass, with minimal perirenal stranding, the kidney is mildly displaced inferiorly due to the mass. No hydronephrosis or nephrolithiasis. The right kidney is unremarkable.  Left adrenal 4.7 x 5.2 cm mass, right adrenal 11 mm mass. Small retroperitoneal lymph nodes which are not pathologically enlarged by size criteria.  The stomach, small and large bowel are overall normal in course and caliber without inflammatory changes, contrast is yet to reach the low distal small bowel. Mild amount of retained large bowel stool.  The liver, spleen, and gallbladder are unremarkable. Fatty pancreas. Great vessels are normal in course and caliber with mild calcific atherosclerosis. Prostate is 5.1 cm in transaxial dimension and mildly invades the base of the urinary bladder.  Small fat containing inguinal hernias, small fat containing umbilical hernia. Mild degenerative change of the spine, 7 mm sclerotic lesion left iliac bone, axial 66/101, patient is osteopenic with lucent appearance of the sacrum, left greater than right, for example axial 66/101. Multiple remote apparent anterior rib fractures.  IMPRESSION: At least 12.7 x 11.3 x 11.5 cm left upper pole renal mass, difficult to further characterize on this noncontrast examination, highly concerning for primary renal neoplasm.  5.2 cm left adrenal mass, 11 mm right adrenal mass in addition to multiple pulmonary nodules /masses, largest in the right middle lobe measuring 3 x 4.2 cm, highly concerning for metastatic disease. Patient is osteopenic with lucencies in the sacrum may reflect focal osteopenia, less likely lytic metastasis, sclerotic 7 mm left iliac bone lesion. PET-CT could be performed to evaluate for  extent of disease.   Electronically Signed   By: Awilda Metro   On: 11/23/2012 05:20   X-ray Chest Pa And Lateral   11/23/2012   CLINICAL DATA:  Atrial fibrillation.  Chest pain.  EXAM: CHEST  2 VIEW  COMPARISON:  03/03/2004  FINDINGS: Low lung volumes are present, causing crowding of the pulmonary vasculature. Apical lordotic projection accentuates cardiac size. Mild cardiomegaly. Thoracic spondylosis is present. Peripheral density in the left mid chest.  IMPRESSION: 1. Peripheral density left mid chest. Although this may partially be due to superimposed bony structures, cannot exclude peripheral nodule or pneumonia. Repeat chest radiography or chest CT recommended for further workup. 2. Mild cardiomegaly. 3. Thoracic spondylosis. 4. Low lung volumes.   Electronically Signed   By: Herbie Baltimore M.D.   On: 11/23/2012 02:53    ASSESSMENT: Shawn Cole 62 y.o. 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 Probable Metastatic renal cell carcinoma of the left kidney.  --He is ECOG 3. He is no longer walking but using a wheelchair due to significant pain related to ambulation.  He reports increasing anorexia, fatigue and general malaise.    --We reviewed his images in detail which are consistent with metastatic renal cell carcinoma.  We do not have a tissue diagnosis.  We reviewed options per urology including cytoreductive nephrectomy plus chemotherapy.  He declines surgery.  In fact, we discussed possibly obtaining a tissue biopsy prior to starting possible TKI therapy, which he also declined presently.  He elects to wait for his PET/CT to further determine the  EOD and he had questions regarding palliative care and hospice.  He requests a referral to hospice for further information.   We referred him to hospice at his request.    --We provided him an overview of chemotherapy options for metastatic disease including sutent, first line (category one) in this setting.   This oral therapy has demonstrated a survival advantage in patients with metastatic disease.  He stated that he did not wish to pursue this presently but will reconsider based on the results of further imaging. We discussed without treatment, his disease would progress and result in organ dysfunction, further worsening of his symptoms.  In addition, without treatment his overall prognosis is measured in months.  He understood this and stated he was unlikely able to tolerate chemotherapy and/or surgery.    --If confirmed Stage IV kidney cancer, he has the following poor prognostic features including an elevated calcium (corrected for albumin of 3, 11.1), low hemoglobin of 10.2, greater than 2 sites (especially those outside the lung-only) and an LDH greater than 1.5 upper limit of normal.   2. Microcytic anemia, moderate. --Continue ferrous sulfate 325 mg bid.  Etiology unclear.   Check ferritin and iron studies if he elects further chemotherapy.    3. Hypercalcemia, likely secondary to #1. --He denies constipation.   If he elects chemotherapy, this might improve.  Otherwise, we will continue supportive care.   4. Hyponatremia.  5. Anorexia/Weight Lost/ Hypoalbunemia secondary to #1. --Continue ensure as tolerated.   6. Poor functional status.    7. Atrial Fibrillation. --Continue Digoxin 0.125 mg daily  8. Hypertension.  --Continue metoprolol 100 mg bid.    9. Hyperlipidemia. -- Continue atorvastatin 10 mg daily.   10. Pain secondary to #1. --Continue oxycodone-acetaminophen 5-325 mg one or two tablets every 4 to 6 hours as needed for pain.  He is also taking colace 100 mg bid.     All questions were answered. The patient knows to call the clinic with any problems, questions or concerns. We can certainly see the patient much sooner if necessary.  I spent 25 minutes counseling the patient face to face. The total time spent in the appointment was 40 minutes.    Abhishek Levesque, MD 12/10/2012 9:51 PM

## 2012-12-11 ENCOUNTER — Telehealth: Payer: Self-pay

## 2012-12-11 ENCOUNTER — Telehealth: Payer: Self-pay | Admitting: Internal Medicine

## 2012-12-11 NOTE — Telephone Encounter (Signed)
lvm for desk nurse that Dr. Rosie Fate put in an order for pt to go to hospice

## 2012-12-11 NOTE — Telephone Encounter (Signed)
S/w [facility] [place] and she said that pt did not admit to hospice. He still wants to explore any treatment options with MD.

## 2012-12-11 NOTE — Telephone Encounter (Signed)
Faxed hospice referal to [facility] [place]

## 2012-12-14 ENCOUNTER — Encounter (HOSPITAL_COMMUNITY)
Admission: RE | Admit: 2012-12-14 | Discharge: 2012-12-14 | Disposition: A | Discharge status: Home / Self Care | Payer: Medicare Other | Source: Ambulatory Visit | Attending: Urology | Admitting: Urology

## 2012-12-14 DIAGNOSIS — C649 Malignant neoplasm of unspecified kidney, except renal pelvis: Secondary | ICD-10-CM | POA: Insufficient documentation

## 2012-12-14 MED ORDER — FLUDEOXYGLUCOSE F - 18 (FDG) INJECTION
15.2000 | Freq: Once | INTRAVENOUS | Status: AC | PRN
  Administered 2012-12-14: 15.2 via INTRAVENOUS

## 2013-01-18 ENCOUNTER — Telehealth: Payer: Self-pay

## 2013-01-18 NOTE — Telephone Encounter (Signed)
lvm that we had not heard from pt since 12/5 when we referred him to hospice.

## 2013-02-13 ENCOUNTER — Inpatient Hospital Stay (HOSPITAL_COMMUNITY)
Admission: AD | Admit: 2013-02-13 | Discharge: 2013-02-20 | DRG: 292 | Disposition: A | Discharge status: Hospice | Payer: Medicare Other | Source: Ambulatory Visit | Attending: Cardiovascular Disease | Admitting: Cardiovascular Disease

## 2013-02-13 ENCOUNTER — Encounter (HOSPITAL_COMMUNITY): Payer: Self-pay | Admitting: General Practice

## 2013-02-13 DIAGNOSIS — R319 Hematuria, unspecified: Secondary | ICD-10-CM | POA: Diagnosis not present

## 2013-02-13 DIAGNOSIS — I10 Essential (primary) hypertension: Secondary | ICD-10-CM | POA: Diagnosis present

## 2013-02-13 DIAGNOSIS — Z91041 Radiographic dye allergy status: Secondary | ICD-10-CM

## 2013-02-13 DIAGNOSIS — F411 Generalized anxiety disorder: Secondary | ICD-10-CM | POA: Diagnosis present

## 2013-02-13 DIAGNOSIS — I501 Left ventricular failure: Principal | ICD-10-CM | POA: Diagnosis present

## 2013-02-13 DIAGNOSIS — Y9210 Unspecified residential institution as the place of occurrence of the external cause: Secondary | ICD-10-CM | POA: Diagnosis not present

## 2013-02-13 DIAGNOSIS — Z515 Encounter for palliative care: Secondary | ICD-10-CM

## 2013-02-13 DIAGNOSIS — C649 Malignant neoplasm of unspecified kidney, except renal pelvis: Secondary | ICD-10-CM | POA: Diagnosis present

## 2013-02-13 DIAGNOSIS — D509 Iron deficiency anemia, unspecified: Secondary | ICD-10-CM | POA: Diagnosis present

## 2013-02-13 DIAGNOSIS — D62 Acute posthemorrhagic anemia: Secondary | ICD-10-CM | POA: Diagnosis present

## 2013-02-13 DIAGNOSIS — C642 Malignant neoplasm of left kidney, except renal pelvis: Secondary | ICD-10-CM

## 2013-02-13 DIAGNOSIS — Z87891 Personal history of nicotine dependence: Secondary | ICD-10-CM

## 2013-02-13 DIAGNOSIS — I509 Heart failure, unspecified: Secondary | ICD-10-CM | POA: Diagnosis present

## 2013-02-13 DIAGNOSIS — E669 Obesity, unspecified: Secondary | ICD-10-CM | POA: Diagnosis present

## 2013-02-13 DIAGNOSIS — C7800 Secondary malignant neoplasm of unspecified lung: Secondary | ICD-10-CM | POA: Diagnosis present

## 2013-02-13 DIAGNOSIS — I82409 Acute embolism and thrombosis of unspecified deep veins of unspecified lower extremity: Secondary | ICD-10-CM

## 2013-02-13 DIAGNOSIS — Z79899 Other long term (current) drug therapy: Secondary | ICD-10-CM

## 2013-02-13 DIAGNOSIS — F329 Major depressive disorder, single episode, unspecified: Secondary | ICD-10-CM | POA: Diagnosis present

## 2013-02-13 DIAGNOSIS — F3289 Other specified depressive episodes: Secondary | ICD-10-CM | POA: Diagnosis present

## 2013-02-13 DIAGNOSIS — E039 Hypothyroidism, unspecified: Secondary | ICD-10-CM | POA: Diagnosis present

## 2013-02-13 DIAGNOSIS — E8809 Other disorders of plasma-protein metabolism, not elsewhere classified: Secondary | ICD-10-CM | POA: Diagnosis present

## 2013-02-13 DIAGNOSIS — Z7982 Long term (current) use of aspirin: Secondary | ICD-10-CM

## 2013-02-13 DIAGNOSIS — Z66 Do not resuscitate: Secondary | ICD-10-CM

## 2013-02-13 DIAGNOSIS — I5021 Acute systolic (congestive) heart failure: Secondary | ICD-10-CM | POA: Diagnosis present

## 2013-02-13 DIAGNOSIS — E785 Hyperlipidemia, unspecified: Secondary | ICD-10-CM | POA: Diagnosis present

## 2013-02-13 DIAGNOSIS — I4891 Unspecified atrial fibrillation: Secondary | ICD-10-CM | POA: Diagnosis present

## 2013-02-13 DIAGNOSIS — T45515A Adverse effect of anticoagulants, initial encounter: Secondary | ICD-10-CM | POA: Diagnosis not present

## 2013-02-13 HISTORY — DX: Shortness of breath: R06.02

## 2013-02-13 HISTORY — DX: Cardiac arrhythmia, unspecified: I49.9

## 2013-02-13 HISTORY — DX: Malignant neoplasm of unspecified kidney, except renal pelvis: C64.9

## 2013-02-13 HISTORY — DX: Heart failure, unspecified: I50.9

## 2013-02-13 LAB — CBC WITH DIFFERENTIAL/PLATELET
BASOS ABS: 0 10*3/uL (ref 0.0–0.1)
Basophils Relative: 0 % (ref 0–1)
EOS ABS: 0.1 10*3/uL (ref 0.0–0.7)
Eosinophils Relative: 1 % (ref 0–5)
HCT: 27 % — ABNORMAL LOW (ref 39.0–52.0)
Hemoglobin: 7.8 g/dL — ABNORMAL LOW (ref 13.0–17.0)
LYMPHS ABS: 1.5 10*3/uL (ref 0.7–4.0)
Lymphocytes Relative: 21 % (ref 12–46)
MCH: 20.6 pg — ABNORMAL LOW (ref 26.0–34.0)
MCHC: 28.9 g/dL — ABNORMAL LOW (ref 30.0–36.0)
MCV: 71.2 fL — AB (ref 78.0–100.0)
Monocytes Absolute: 0.8 10*3/uL (ref 0.1–1.0)
Monocytes Relative: 11 % (ref 3–12)
Neutro Abs: 4.8 10*3/uL (ref 1.7–7.7)
Neutrophils Relative %: 67 % (ref 43–77)
PLATELETS: 288 10*3/uL (ref 150–400)
RBC: 3.79 MIL/uL — ABNORMAL LOW (ref 4.22–5.81)
RDW: 18.5 % — AB (ref 11.5–15.5)
WBC: 7.2 10*3/uL (ref 4.0–10.5)

## 2013-02-13 LAB — COMPREHENSIVE METABOLIC PANEL
ALBUMIN: 1.8 g/dL — AB (ref 3.5–5.2)
ALT: 7 U/L (ref 0–53)
AST: 19 U/L (ref 0–37)
Alkaline Phosphatase: 66 U/L (ref 39–117)
BUN: 10 mg/dL (ref 6–23)
CALCIUM: 8.3 mg/dL — AB (ref 8.4–10.5)
CO2: 24 mEq/L (ref 19–32)
CREATININE: 0.88 mg/dL (ref 0.50–1.35)
Chloride: 93 mEq/L — ABNORMAL LOW (ref 96–112)
GFR calc Af Amer: >90 mL/min (ref >90)
Glucose, Bld: 108 mg/dL — ABNORMAL HIGH (ref 70–99)
Potassium: 4.1 mEq/L (ref 3.7–5.3)
Sodium: 134 mEq/L — ABNORMAL LOW (ref 137–147)
Total Bilirubin: 0.3 mg/dL (ref 0.3–1.2)
Total Protein: 7.5 g/dL (ref 6.0–8.3)

## 2013-02-13 LAB — PROTIME-INR
INR: 1.05 (ref 0.00–1.49)
Prothrombin Time: 13.5 seconds (ref 11.6–15.2)

## 2013-02-13 LAB — PRO B NATRIURETIC PEPTIDE: PRO B NATRI PEPTIDE: 3764 pg/mL — AB (ref 0–125)

## 2013-02-13 LAB — TROPONIN I

## 2013-02-13 MED ORDER — ONDANSETRON HCL 4 MG/2ML IJ SOLN
4.0000 mg | Freq: Four times a day (QID) | INTRAMUSCULAR | Status: DC | PRN
  Administered 2013-02-15 – 2013-02-18 (×2): 4 mg via INTRAVENOUS
  Filled 2013-02-13 (×2): qty 2

## 2013-02-13 MED ORDER — ACETAMINOPHEN 325 MG PO TABS
650.0000 mg | ORAL_TABLET | ORAL | Status: DC | PRN
  Administered 2013-02-14 – 2013-02-19 (×3): 650 mg via ORAL
  Filled 2013-02-13 (×3): qty 2

## 2013-02-13 MED ORDER — SODIUM CHLORIDE 0.9 % IJ SOLN: 3.0000 mL | INTRAMUSCULAR | Status: DC | PRN

## 2013-02-13 MED ORDER — HYDROCODONE-ACETAMINOPHEN 5-325 MG PO TABS
1.0000 | ORAL_TABLET | ORAL | Status: DC | PRN
  Administered 2013-02-15 – 2013-02-20 (×8): 1 via ORAL
  Filled 2013-02-13 (×9): qty 1

## 2013-02-13 MED ORDER — POTASSIUM CHLORIDE CRYS ER 20 MEQ PO TBCR
20.0000 meq | EXTENDED_RELEASE_TABLET | Freq: Two times a day (BID) | ORAL | Status: DC
  Administered 2013-02-13 – 2013-02-15 (×5): 20 meq via ORAL
  Filled 2013-02-13 (×7): qty 1

## 2013-02-13 MED ORDER — DIGOXIN 125 MCG PO TABS
0.1250 mg | ORAL_TABLET | Freq: Every day | ORAL | Status: DC
  Administered 2013-02-14 – 2013-02-20 (×7): 0.125 mg via ORAL
  Filled 2013-02-13 (×8): qty 1

## 2013-02-13 MED ORDER — FUROSEMIDE 10 MG/ML IJ SOLN: 40.0000 mg | Freq: Two times a day (BID) | INTRAMUSCULAR | Status: DC

## 2013-02-13 MED ORDER — ENSURE COMPLETE PO LIQD: 237.0000 mL | ORAL | Status: DC

## 2013-02-13 MED ORDER — FERROUS SULFATE 325 (65 FE) MG PO TABS
325.0000 mg | ORAL_TABLET | Freq: Two times a day (BID) | ORAL | Status: DC
  Administered 2013-02-14 – 2013-02-20 (×13): 325 mg via ORAL
  Filled 2013-02-13 (×15): qty 1

## 2013-02-13 MED ORDER — ALPRAZOLAM 0.25 MG PO TABS: 0.2500 mg | ORAL_TABLET | Freq: Two times a day (BID) | ORAL | Status: DC | PRN

## 2013-02-13 MED ORDER — PANTOPRAZOLE SODIUM 40 MG PO TBEC
40.0000 mg | DELAYED_RELEASE_TABLET | Freq: Every day | ORAL | Status: DC
  Administered 2013-02-13 – 2013-02-20 (×8): 40 mg via ORAL
  Filled 2013-02-13 (×7): qty 1

## 2013-02-13 MED ORDER — ATORVASTATIN CALCIUM 10 MG PO TABS
10.0000 mg | ORAL_TABLET | Freq: Every day | ORAL | Status: DC
  Administered 2013-02-13 – 2013-02-20 (×8): 10 mg via ORAL
  Filled 2013-02-13 (×8): qty 1

## 2013-02-13 MED ORDER — ASPIRIN EC 81 MG PO TBEC
81.0000 mg | DELAYED_RELEASE_TABLET | Freq: Every day | ORAL | Status: DC
  Administered 2013-02-14 – 2013-02-19 (×6): 81 mg via ORAL
  Filled 2013-02-13 (×7): qty 1

## 2013-02-13 MED ORDER — SODIUM CHLORIDE 0.9 % IV SOLN
250.0000 mL | INTRAVENOUS | Status: DC | PRN
  Administered 2013-02-17: 250 mL via INTRAVENOUS

## 2013-02-13 MED ORDER — HEPARIN SODIUM (PORCINE) 5000 UNIT/ML IJ SOLN
5000.0000 [IU] | Freq: Three times a day (TID) | INTRAMUSCULAR | Status: DC
Start: 2013-02-13 — End: 2013-02-14
  Administered 2013-02-13 – 2013-02-14 (×3): 5000 [IU] via SUBCUTANEOUS
  Filled 2013-02-13 (×5): qty 1

## 2013-02-13 MED ORDER — SODIUM CHLORIDE 0.9 % IJ SOLN
3.0000 mL | Freq: Two times a day (BID) | INTRAMUSCULAR | Status: DC
Start: 2013-02-13 — End: 2013-02-18
  Administered 2013-02-13 – 2013-02-17 (×8): 3 mL via INTRAVENOUS

## 2013-02-13 MED ORDER — DOCUSATE SODIUM 100 MG PO CAPS
100.0000 mg | ORAL_CAPSULE | Freq: Two times a day (BID) | ORAL | Status: DC
  Administered 2013-02-13 – 2013-02-20 (×13): 100 mg via ORAL
  Filled 2013-02-13 (×17): qty 1

## 2013-02-13 MED ORDER — LEVOTHYROXINE SODIUM 25 MCG PO TABS
25.0000 ug | ORAL_TABLET | Freq: Every day | ORAL | Status: DC
  Administered 2013-02-14 – 2013-02-16 (×3): 25 ug via ORAL
  Filled 2013-02-13 (×4): qty 1

## 2013-02-13 NOTE — H&P (Signed)
Shawn Cole is an 63 y.o. male.   Chief Complaint: Leg edema and shortness of breath HPI: 63 year old male with left renal call cancer with pulmonary metastasis, hypothyroidism, obesity and iron deficiency anemia has 2 weeks history of progressively worsening leg edema and shortness of breath without fever and cough.  Past Medical History  Diagnosis Date  . Hypertension   . Hypothyroidism   . Hyperlipidemia   . Dehydration       Past Surgical History  Procedure Laterality Date  . Irrigation and debridement abscess Left 05/04/2012    Procedure: MINOR INCISION AND DRAINAGE OF ABSCESS;  Surgeon: Cammie Sickle., MD;  Location: [place];  Service: Orthopedics;  Laterality: Left;    No family history on file. Social History:  reports that he quit smoking about 16 years ago. He has never used smokeless tobacco. He reports that he does not drink alcohol or use illicit drugs.  Allergies:  Allergies  Allergen Reactions  . Contrast Media [Iodinated Diagnostic Agents] Other (See Comments)    "couldn"t use hands or walk, sob"    Medications Prior to Admission  Medication Sig Dispense Refill  . aspirin 81 MG tablet Take 81 mg by mouth daily.      Marland Kitchen atorvastatin (LIPITOR) 10 MG tablet Take 10 mg by mouth daily.      Marland Kitchen docusate sodium 100 MG CAPS Take 100 mg by mouth 2 (two) times daily.  10 capsule  0  . feeding supplement, ENSURE COMPLETE, (ENSURE COMPLETE) LIQD Take 237 mLs by mouth daily.  30 Bottle  1  . ferrous sulfate 325 (65 FE) MG tablet Take 1 tablet (325 mg total) by mouth 2 (two) times daily with a meal.  60 tablet  3  . levothyroxine (SYNTHROID, LEVOTHROID) 25 MCG tablet Take 25 mcg by mouth daily before breakfast.      . oxyCODONE-acetaminophen (PERCOCET/ROXICET) 5-325 MG per tablet One or two tablets every 4 to 6 hours as needed for pain  30 tablet  0  . PRESCRIPTION MEDICATION Take 1 tablet by mouth daily. Chemo drug      . digoxin (LANOXIN) 0.125 MG tablet  Take 1 tablet (0.125 mg total) by mouth daily.  30 tablet  1    No results found for this or any previous visit (from the past 48 hour(s)). No results found.  ROS Constitutional: Negative.  HENT: Negative.  Eyes: Negative.  Respiratory: Negative.  Cardiovascular: H/O atrial fibrillation. Shortness of breath. Gastrointestinal: Negative.  Genitourinary: Renal cell CA with mets  Skin: Negative.  Neurological: Negative.  Endo/Heme/Allergies: H/O hypothyroidism, obesity.  Psychiatric/Behavioral: Negative.   Blood pressure 99/59, pulse 89, temperature 97.9 F (36.6 C), temperature source Oral, resp. rate 18, SpO2 99.00%. Physical exam: HEENT: [place]/AT, Eyes-Brown, PERL, EOMI, Conjunctiva-Pale pink, Sclera-Non-icteric  Neck: No JVD, No bruit, Trachea midline.  Lungs: Clear, Bilateral.  Cardiac: Regular rhythm, normal S1 and S2, no S3.  Abdomen: Soft, non-tender.  Extremities: 2 + edema present. No cyanosis. No clubbing.  CNS: AxOx3, Cranial nerves grossly intact, moves all 4 extremities. Right handed.  Skin: Warm and dry.  Assessment/Plan Acute left heart systolic failure Left renal cell cancer with pulmonary metastasis  Hypothyroidism  Obesity  Hypoalbuminemia  Anemia, iron deficiency  H/O atrial fibrillation  Admit Blood work including Pro-BNP, Troponin-I and TSH.  EKG/ECHO and CXR. Home medications.  Keivon Garden S 02/13/2013, 7:46 PM

## 2013-02-13 NOTE — Progress Notes (Signed)
Pt arrived to floor direct admit.  Dr Doylene Canard paged by day shift RN.  Vital signs stable, pt in no apparent distress.  Will continue to monitor.

## 2013-02-14 ENCOUNTER — Inpatient Hospital Stay (HOSPITAL_COMMUNITY): Payer: Medicare Other

## 2013-02-14 DIAGNOSIS — R0602 Shortness of breath: Secondary | ICD-10-CM

## 2013-02-14 DIAGNOSIS — R609 Edema, unspecified: Secondary | ICD-10-CM

## 2013-02-14 LAB — TROPONIN I
Troponin I: <0.3 ng/mL (ref <0.30)
Troponin I: <0.3 ng/mL (ref <0.30)

## 2013-02-14 LAB — BASIC METABOLIC PANEL
BUN: 9 mg/dL (ref 6–23)
CO2: 22 meq/L (ref 19–32)
CREATININE: 0.81 mg/dL (ref 0.50–1.35)
Calcium: 8.3 mg/dL — ABNORMAL LOW (ref 8.4–10.5)
Chloride: 94 mEq/L — ABNORMAL LOW (ref 96–112)
GFR calc Af Amer: >90 mL/min (ref >90)
GFR calc non Af Amer: >90 mL/min (ref >90)
Glucose, Bld: 100 mg/dL — ABNORMAL HIGH (ref 70–99)
Potassium: 4.4 mEq/L (ref 3.7–5.3)
Sodium: 134 mEq/L — ABNORMAL LOW (ref 137–147)

## 2013-02-14 LAB — ABO/RH: ABO/RH(D): AB POS

## 2013-02-14 LAB — TSH: TSH: 5.4 u[IU]/mL — ABNORMAL HIGH (ref 0.350–4.500)

## 2013-02-14 LAB — PREPARE RBC (CROSSMATCH)

## 2013-02-14 MED ORDER — ALBUMIN HUMAN 25 % IV SOLN
12.5000 g | Freq: Once | INTRAVENOUS | Status: AC
  Administered 2013-02-14: 12.5 g via INTRAVENOUS
  Filled 2013-02-14: qty 50

## 2013-02-14 MED ORDER — FUROSEMIDE 10 MG/ML IJ SOLN: 20.0000 mg | Freq: Once | INTRAMUSCULAR | Status: DC

## 2013-02-14 MED ORDER — FUROSEMIDE 10 MG/ML IJ SOLN
40.0000 mg | Freq: Two times a day (BID) | INTRAMUSCULAR | Status: AC
  Administered 2013-02-14 (×2): 40 mg via INTRAVENOUS
  Filled 2013-02-14 (×2): qty 4

## 2013-02-14 MED ORDER — PRESCRIPTION MEDICATION: 1.0000 | Freq: Every day | Status: DC

## 2013-02-14 MED ORDER — ENSURE COMPLETE PO LIQD
237.0000 mL | Freq: Two times a day (BID) | ORAL | Status: DC
  Administered 2013-02-14 – 2013-02-20 (×8): 237 mL via ORAL

## 2013-02-14 MED ORDER — ENSURE PUDDING PO PUDG
1.0000 | ORAL | Status: DC
  Administered 2013-02-15 – 2013-02-19 (×5): 1 via ORAL

## 2013-02-14 MED ORDER — PAZOPANIB HCL 200 MG PO TABS
600.0000 mg | ORAL_TABLET | Freq: Every day | ORAL | Status: DC
  Administered 2013-02-14: 600 mg via ORAL
  Filled 2013-02-14: qty 3

## 2013-02-14 NOTE — Progress Notes (Signed)
Bilateral lower extremity venous duplex completed.  Right:  DVT noted in the distal femoral vein.  No evidence of superficial thrombosis.  No Baker's cyst.  Left: DVT noted common femoral, profunda, femoral, popliteal, posterior tibial, and peroneal veins.  There appears to be superficial thrombosis in the left greater saphenous vein.  No Baker's cyst.

## 2013-02-14 NOTE — Progress Notes (Signed)
INITIAL NUTRITION ASSESSMENT  DOCUMENTATION CODES Per approved criteria  -Not Applicable   INTERVENTION: Provide Ensure Complete po BID, each supplement provides 350 kcal and 13 grams of protein Provide Ensure Pudding once daily Encourage PO intake  NUTRITION DIAGNOSIS: Increased nutrient needs related to catabolic illness as evidenced by estimated energy and protein needs.   Goal: Pt to meet >/= 90% of their estimated nutrition needs   Monitor:  PO intake, weight, labs, I/O's  Reason for Assessment: Malnutrition Screening Tool, score of 2  63 y.o. male  Admitting Dx: <principal problem not specified>  ASSESSMENT: 63 year old male with left renal call cancer with pulmonary metastasis, hypothyroidism, and iron deficiency anemia has 2 weeks history of progressively worsening leg edema and shortness of breath without fever and cough.  Pt states that his appetite has been fair and reports eating most of his breakfast this morning. Pt reports losing weight several months ago but, maintaining his weight for the past few months. He reports drinking Ensure supplements twice daily at home PTA to help him maintain his weight. Per weight history, pt's weight has been stable for the past 3 months.   Height: Ht Readings from Last 1 Encounters:  02/13/13 6\' 2"  (1.88 m)    Weight: Wt Readings from Last 1 Encounters:  02/14/13 215 lb 12.8 oz (97.886 kg)    Ideal Body Weight: 190 lbs  % Ideal Body Weight: 113%  Wt Readings from Last 10 Encounters:  02/14/13 215 lb 12.8 oz (97.886 kg)  12/08/12 213 lb 1.6 oz (96.662 kg)  11/26/12 216 lb 0.8 oz (98 kg)    Usual Body Weight: 215 lbs  % Usual Body Weight: 100%  BMI:  Body mass index is 27.7 kg/(m^2).  Estimated Nutritional Needs: Kcal: 2300-2500 Protein: 115-125 grams Fluid: 1500 ml fluid restriction per MD  Skin: +3 RLE and LLE edema; intact  Diet Order: Cardiac  EDUCATION NEEDS: -No education needs identified at this  time   Intake/Output Summary (Last 24 hours) at 02/14/13 1046 Last data filed at 02/14/13 0230  Gross per 24 hour  Intake    363 ml  Output    200 ml  Net    163 ml    Last BM: 2/10   Labs:   Recent Labs Lab 02/13/13 2048 02/14/13 0136  NA 134* 134*  K 4.1 4.4  CL 93* 94*  CO2 24 22  BUN 10 9  CREATININE 0.88 0.81  CALCIUM 8.3* 8.3*  GLUCOSE 108* 100*    CBG (last 3)  No results found for this basename: GLUCAP,  in the last 72 hours  Scheduled Meds: . albumin human  12.5 g Intravenous Once  . aspirin EC  81 mg Oral Daily  . atorvastatin  10 mg Oral Daily  . digoxin  0.125 mg Oral Daily  . docusate sodium  100 mg Oral BID  . feeding supplement (ENSURE COMPLETE)  237 mL Oral Q24H  . ferrous sulfate  325 mg Oral BID WC  . furosemide  40 mg Intravenous Q12H  . heparin  5,000 Units Subcutaneous 3 times per day  . levothyroxine  25 mcg Oral QAC breakfast  . pantoprazole  40 mg Oral Daily  . potassium chloride SA  20 mEq Oral BID  . PRESCRIPTION MEDICATION 1 tablet  1 tablet Oral Daily  . sodium chloride  3 mL Intravenous Q12H    Continuous Infusions:   Past Medical History  Diagnosis Date  . Hypertension   .  Hypothyroidism   . Hyperlipidemia   . Dehydration   . CHF (congestive heart failure)   . Exertional shortness of breath   . Renal cell carcinoma     Past Surgical History  Procedure Laterality Date  . Irrigation and debridement abscess Left 05/04/2012    Procedure: MINOR INCISION AND DRAINAGE OF ABSCESS;  Surgeon: Cammie Sickle., MD;  Location: [facility];  Service: Orthopedics;  Laterality: Left;    Pryor Ochoa RD, LDN Inpatient Clinical Dietitian Pager: [phone] After Hours Pager: [phone]

## 2013-02-14 NOTE — Progress Notes (Signed)
PT Cancellation Note  Patient Details Name: Shawn Cole MRN: [id number] DOB: [birth year]   Cancelled Treatment:    Reason Eval/Treat Not Completed: Patient not medically ready. Pt with bilat LE DVTs. Per Keane Scrape, RN pt in process of deciding whether to pursue treatment or transition to hospice due to pt's history of cancer. PT to return if/when appropriate.   Kingsley Callander 02/14/2013, 3:42 PM

## 2013-02-14 NOTE — Progress Notes (Signed)
Pt HR up to 140-150 non-sustained when up to use the bathroom. Pt in A fib per EKG and monitor. Pt with history of A fib per previous EKGs in chart. Will continue to monitor. Ronnette Hila, RN

## 2013-02-14 NOTE — Progress Notes (Signed)
I cosign with Farley Ly Caldwell's assessments, medication administration, notes, intake and output, and documentation for this shift. Ronnette Hila, RN

## 2013-02-14 NOTE — Progress Notes (Signed)
  Echocardiogram 2D Echocardiogram has been performed.  Shawn Cole FRANCES 02/14/2013, 5:50 PM

## 2013-02-14 NOTE — Progress Notes (Signed)
Subjective:  Feeling little better post lasix use. Low Hgb of 7.8 g/dL from 10.2 g/dL 2 months ago, dark heme positive stools and bilateral Leg DVT-which were not there 2 weeks ago per patient taking treatment at [facility]. Patient on Pazopanib which has side effects of GI bleed/perforation and DVT.  Objective:  Vital Signs in the last 24 hours: Temp:  [97.6 F (36.4 C)-98 F (36.7 C)] 97.7 F (36.5 C) (02/11 1500) Pulse Rate:  [89-101] 89 (02/11 1500) Cardiac Rhythm:  [-] Atrial fibrillation (02/11 1100) Resp:  [18-20] 20 (02/11 1500) BP: (99-107)/(56-66) 107/59 mmHg (02/11 1500) SpO2:  [96 %-99 %] 96 % (02/11 1500) Weight:  [97.659 kg (215 lb 4.8 oz)-97.886 kg (215 lb 12.8 oz)] 97.886 kg (215 lb 12.8 oz) (02/11 0234)  Physical Exam: BP Readings from Last 1 Encounters:  02/14/13 107/59     Wt Readings from Last 1 Encounters:  02/14/13 97.886 kg (215 lb 12.8 oz)    Weight change:   HEENT: [place]/AT, Eyes-Brown, PERL, EOMI, Conjunctiva-Pink, Sclera-Non-icteric Neck: No JVD, No bruit, Trachea midline. Lungs:  Clear, Bilateral. Cardiac:  Regular rhythm, normal S1 and S2, no S3. II/VI systolic murmur. Abdomen:  Soft, non-tender. Extremities:  2 + edema present. No cyanosis. No clubbing. CNS: AxOx3, Cranial nerves grossly intact, moves all 4 extremities. Right handed. Skin: Warm and dry.   Intake/Output from previous day: 02/10 0701 - 02/11 0700 In: 363 [P.O.:360; I.V.:3] Out: 200 [Urine:200]    Lab Results: BMET    Component Value Date/Time   NA 134* 02/14/2013 0136   NA 134* 12/08/2012 1549   K 4.4 02/14/2013 0136   K 4.5 12/08/2012 1549   CL 94* 02/14/2013 0136   CO2 22 02/14/2013 0136   CO2 26 12/08/2012 1549   GLUCOSE 100* 02/14/2013 0136   GLUCOSE 99 12/08/2012 1549   BUN 9 02/14/2013 0136   BUN 7.6 12/08/2012 1549   CREATININE 0.81 02/14/2013 0136   CREATININE 0.8 12/08/2012 1549   CALCIUM 8.3* 02/14/2013 0136   CALCIUM 10.5* 12/08/2012 1549   GFRNONAA >90 02/14/2013  0136   GFRAA >90 02/14/2013 0136   CBC    Component Value Date/Time   WBC 7.2 02/13/2013 2048   WBC 5.9 12/08/2012 1549   RBC 3.79* 02/13/2013 2048   RBC 5.06 12/08/2012 1549   HGB 7.8* 02/13/2013 2048   HGB 10.2* 12/08/2012 1549   HCT 27.0* 02/13/2013 2048   HCT 34.1* 12/08/2012 1549   PLT 288 02/13/2013 2048   PLT 313 12/08/2012 1549   MCV 71.2* 02/13/2013 2048   MCV 67.4* 12/08/2012 1549   MCH 20.6* 02/13/2013 2048   MCH 20.2* 12/08/2012 1549   MCHC 28.9* 02/13/2013 2048   MCHC 29.9* 12/08/2012 1549   RDW 18.5* 02/13/2013 2048   RDW 17.2* 12/08/2012 1549   LYMPHSABS 1.5 02/13/2013 2048   LYMPHSABS 1.5 12/08/2012 1549   MONOABS 0.8 02/13/2013 2048   MONOABS 0.5 12/08/2012 1549   EOSABS 0.1 02/13/2013 2048   EOSABS 0.1 12/08/2012 1549   BASOSABS 0.0 02/13/2013 2048   BASOSABS 0.0 12/08/2012 1549   CARDIAC ENZYMES Lab Results  Component Value Date   TROPONINI <0.30 02/14/2013    Scheduled Meds: . aspirin EC  81 mg Oral Daily  . atorvastatin  10 mg Oral Daily  . digoxin  0.125 mg Oral Daily  . docusate sodium  100 mg Oral BID  . feeding supplement (ENSURE COMPLETE)  237 mL Oral BID BM  . feeding supplement (ENSURE)  1 Container Oral Q24H  . ferrous sulfate  325 mg Oral BID WC  . furosemide  20 mg Intravenous Once  . furosemide  40 mg Intravenous Q12H  . heparin  5,000 Units Subcutaneous 3 times per day  . levothyroxine  25 mcg Oral QAC breakfast  . pantoprazole  40 mg Oral Daily  . potassium chloride SA  20 mEq Oral BID  . 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 Paged patient's Oncologist in [place]. Talked to on call doctor. Discussed care with family(3 sisters) and patient and they are in agreement.  Patient is willing to use hospice/comfort care if needed. Surgical and GI consult post transfusion.   LOS: 1 day    Dixie Dials  MD  02/14/2013, 5:50 PM

## 2013-02-14 NOTE — Progress Notes (Signed)
Pt stable, VSS. Pt received 12.5g Albumin at 1058.  Pharmacy verified votrient from home to be given in hospital.

## 2013-02-14 NOTE — Progress Notes (Signed)
I co-sign Durwin Nora assessments, notes, and documentation

## 2013-02-14 NOTE — Progress Notes (Signed)
Pt noted to have gauze bandage to right chest/abdomen. Patient states it is from a chest tube placed a couple months ago. Patient states an RN changes dressing twice a week. Patient stated the dressing does not need to be changed right now. Will continue to monitor. Ronnette Hila, RN

## 2013-02-14 NOTE — Progress Notes (Signed)
Utilization Review Completed Corianna Avallone J. Shlok Raz, RN, BSN, NCM [phone]  

## 2013-02-15 ENCOUNTER — Encounter (HOSPITAL_COMMUNITY): Payer: Medicare Other | Admitting: Anesthesiology

## 2013-02-15 ENCOUNTER — Inpatient Hospital Stay (HOSPITAL_COMMUNITY): Payer: Medicare Other | Admitting: Anesthesiology

## 2013-02-15 ENCOUNTER — Encounter (HOSPITAL_COMMUNITY): Payer: Self-pay | Admitting: Gastroenterology

## 2013-02-15 ENCOUNTER — Encounter (HOSPITAL_COMMUNITY)
Admission: AD | Disposition: A | Discharge status: Hospice | Payer: Self-pay | Source: Ambulatory Visit | Attending: Cardiovascular Disease

## 2013-02-15 HISTORY — PX: ESOPHAGOGASTRODUODENOSCOPY: SHX5428

## 2013-02-15 LAB — BASIC METABOLIC PANEL
BUN: 9 mg/dL (ref 6–23)
CALCIUM: 8 mg/dL — AB (ref 8.4–10.5)
CO2: 26 meq/L (ref 19–32)
Chloride: 94 mEq/L — ABNORMAL LOW (ref 96–112)
Creatinine, Ser: 0.86 mg/dL (ref 0.50–1.35)
GFR calc Af Amer: >90 mL/min (ref >90)
GFR calc non Af Amer: >90 mL/min (ref >90)
Glucose, Bld: 93 mg/dL (ref 70–99)
Potassium: 4.6 mEq/L (ref 3.7–5.3)
Sodium: 135 mEq/L — ABNORMAL LOW (ref 137–147)

## 2013-02-15 LAB — HEMOGLOBIN AND HEMATOCRIT, BLOOD
HCT: 31 % — ABNORMAL LOW (ref 39.0–52.0)
HEMOGLOBIN: 9.1 g/dL — AB (ref 13.0–17.0)

## 2013-02-15 SURGERY — EGD (ESOPHAGOGASTRODUODENOSCOPY)
Anesthesia: Monitor Anesthesia Care

## 2013-02-15 MED ORDER — FUROSEMIDE 10 MG/ML IJ SOLN
40.0000 mg | Freq: Once | INTRAMUSCULAR | Status: AC
  Administered 2013-02-15: 40 mg via INTRAVENOUS
  Filled 2013-02-15: qty 4

## 2013-02-15 MED ORDER — SODIUM CHLORIDE 0.9 % IV SOLN
INTRAVENOUS | Status: DC
  Administered 2013-02-15: 500 mL via INTRAVENOUS

## 2013-02-15 MED ORDER — PROPOFOL INFUSION 10 MG/ML OPTIME
INTRAVENOUS | Status: DC | PRN
  Administered 2013-02-15: 140 ug/kg/min via INTRAVENOUS

## 2013-02-15 MED ORDER — SODIUM CHLORIDE 0.9 % IV SOLN
INTRAVENOUS | Status: DC | PRN
  Administered 2013-02-15: 14:00:00 via INTRAVENOUS

## 2013-02-15 MED ORDER — PHENYLEPHRINE HCL 10 MG/ML IJ SOLN
INTRAMUSCULAR | Status: DC | PRN
  Administered 2013-02-15: 80 ug via INTRAVENOUS

## 2013-02-15 NOTE — Plan of Care (Signed)
Problem: Phase I Progression Outcomes Goal: EF % per last Echo/documented,Core Reminder form on chart Outcome: Completed/Met Date Met:  02/15/13 EF 50-55%(02-14-13)

## 2013-02-15 NOTE — Progress Notes (Signed)
Subjective:  Asking for palliative care. Half heartedly accepts GI and surgical consults. Hgb improved to 9.1 post 1 unit of blood. Afebrile.  Objective:  Vital Signs in the last 24 hours: Temp:  [97.2 F (36.2 C)-98.3 F (36.8 C)] 97.7 F (36.5 C) (02/12 0510) Pulse Rate:  [89-104] 93 (02/12 0537) Cardiac Rhythm:  [-] Atrial fibrillation (02/11 2030) Resp:  [16-20] 17 (02/12 0510) BP: (92-110)/(50-71) 102/57 mmHg (02/12 0537) SpO2:  [96 %-100 %] 98 % (02/12 0510) Weight:  [96.525 kg (212 lb 12.8 oz)] 96.525 kg (212 lb 12.8 oz) (02/12 0510)  Physical Exam: BP Readings from Last 1 Encounters:  02/15/13 102/57     Wt Readings from Last 1 Encounters:  02/15/13 96.525 kg (212 lb 12.8 oz)    Weight change: -1.134 kg (-2 lb 8 oz)  HEENT: [place]/AT, Eyes-Brown, PERL, EOMI, Conjunctiva-Pale pink, Sclera-Non-icteric Neck: No JVD, No bruit, Trachea midline. Lungs:  Clear, Bilateral. Cardiac:  Regular rhythm, normal S1 and S2, no S3. Ii/VI systolic murmur Abdomen:  Soft, non-tender. Extremities:  2 + edema present. No cyanosis. No clubbing. CNS: AxOx3, Cranial nerves grossly intact, moves all 4 extremities. Right handed. Skin: Warm and dry.   Intake/Output from previous day: 02/11 0701 - 02/12 0700 In: 1140 [P.O.:840; Blood:300] Out: 2401 [Urine:2400; Stool:1]    Lab Results: BMET    Component Value Date/Time   NA 135* 02/15/2013 0706   NA 134* 12/08/2012 1549   K 4.6 02/15/2013 0706   K 4.5 12/08/2012 1549   CL 94* 02/15/2013 0706   CO2 26 02/15/2013 0706   CO2 26 12/08/2012 1549   GLUCOSE 93 02/15/2013 0706   GLUCOSE 99 12/08/2012 1549   BUN 9 02/15/2013 0706   BUN 7.6 12/08/2012 1549   CREATININE 0.86 02/15/2013 0706   CREATININE 0.8 12/08/2012 1549   CALCIUM 8.0* 02/15/2013 0706   CALCIUM 10.5* 12/08/2012 1549   GFRNONAA >90 02/15/2013 0706   GFRAA >90 02/15/2013 0706   CBC    Component Value Date/Time   WBC 7.2 02/13/2013 2048   WBC 5.9 12/08/2012 1549   RBC 3.79* 02/13/2013 2048    RBC 5.06 12/08/2012 1549   HGB 9.1* 02/15/2013 0352   HGB 10.2* 12/08/2012 1549   HCT 31.0* 02/15/2013 0352   HCT 34.1* 12/08/2012 1549   PLT 288 02/13/2013 2048   PLT 313 12/08/2012 1549   MCV 71.2* 02/13/2013 2048   MCV 67.4* 12/08/2012 1549   MCH 20.6* 02/13/2013 2048   MCH 20.2* 12/08/2012 1549   MCHC 28.9* 02/13/2013 2048   MCHC 29.9* 12/08/2012 1549   RDW 18.5* 02/13/2013 2048   RDW 17.2* 12/08/2012 1549   LYMPHSABS 1.5 02/13/2013 2048   LYMPHSABS 1.5 12/08/2012 1549   MONOABS 0.8 02/13/2013 2048   MONOABS 0.5 12/08/2012 1549   EOSABS 0.1 02/13/2013 2048   EOSABS 0.1 12/08/2012 1549   BASOSABS 0.0 02/13/2013 2048   BASOSABS 0.0 12/08/2012 1549   CARDIAC ENZYMES Lab Results  Component Value Date   TROPONINI <0.30 02/14/2013    Scheduled Meds: . aspirin EC  81 mg Oral Daily  . atorvastatin  10 mg Oral Daily  . digoxin  0.125 mg Oral Daily  . docusate sodium  100 mg Oral BID  . feeding supplement (ENSURE COMPLETE)  237 mL Oral BID BM  . feeding supplement (ENSURE)  1 Container Oral Q24H  . ferrous sulfate  325 mg Oral BID WC  . furosemide  20 mg Intravenous Once  . levothyroxine  25 mcg Oral QAC breakfast  . pantoprazole  40 mg Oral Daily  . potassium chloride SA  20 mEq Oral BID  . 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    LOS: 2 days    Shawn Dials  MD  02/15/2013, 9:09 AM

## 2013-02-15 NOTE — Progress Notes (Signed)
Pt a/o, pt was NPO this morning for EGD this afternoon, when pt returned form EGD pt c/o pain in his legs, PRN Vicodin given as ordered,  pt now on heart healthy diet, family at bedside, pt stable

## 2013-02-15 NOTE — Preoperative (Signed)
Beta Blockers   Reason not to administer Beta Blockers:Not Applicable 

## 2013-02-15 NOTE — Anesthesia Postprocedure Evaluation (Signed)
  Anesthesia Post-op Note  Patient: Shawn Cole  Procedure(s) Performed: Procedure(s): ESOPHAGOGASTRODUODENOSCOPY (EGD) (N/A)  Patient Location: PACU and Endoscopy Unit  Anesthesia Type:MAC  Level of Consciousness: awake  Airway and Oxygen Therapy: Patient Spontanous Breathing  Post-op Pain: none  Post-op Assessment: Post-op Vital signs reviewed, Patient's Cardiovascular Status Stable, Respiratory Function Stable, Patent Airway, No signs of Nausea or vomiting and Pain level controlled  Post-op Vital Signs: Reviewed and stable  Complications: No apparent anesthesia complications

## 2013-02-15 NOTE — Progress Notes (Signed)
[facility]  Patient Status: Active (receiving services up to time of hospitalization)  [facility] is providing the following services: RN and PT  If patient discharges after hours, please call [phone].   Shawn Cole 02/15/2013, 5:31 PM

## 2013-02-15 NOTE — Anesthesia Preprocedure Evaluation (Addendum)
Anesthesia Evaluation  Patient identified by MRN, date of birth, ID band Patient awake    Reviewed: Allergy & Precautions, H&P , NPO status , Patient's Chart, lab work & pertinent test results  Airway Mallampati: II TM Distance: >3 FB     Dental   Pulmonary former smoker,  Lung mets + rhonchi         Cardiovascular hypertension, +CHF + dysrhythmias Atrial Fibrillation Rhythm:Regular Rate:Tachycardia     Neuro/Psych    GI/Hepatic Heme pos stool   Endo/Other    Renal/GU Renal diseaseRenal cell ca w/mets     Musculoskeletal   Abdominal   Peds  Hematology  (+) anemia ,   Anesthesia Other Findings   Reproductive/Obstetrics                          Anesthesia Physical Anesthesia Plan  ASA: III  Anesthesia Plan: MAC   Post-op Pain Management:    Induction: Intravenous  Airway Management Planned: Nasal Cannula  Additional Equipment:   Intra-op Plan:   Post-operative Plan:   Informed Consent: I have reviewed the patients History and Physical, chart, labs and discussed the procedure including the risks, benefits and alternatives for the proposed anesthesia with the patient or authorized representative who has indicated his/her understanding and acceptance.     Plan Discussed with: CRNA and Surgeon  Anesthesia Plan Comments:         Anesthesia Quick Evaluation

## 2013-02-15 NOTE — Op Note (Signed)
[facility] [place], [id number]   ENDOSCOPY PROCEDURE REPORT  PATIENT: Shawn Cole, Shawn Cole  MR#: [id number] BIRTHDATE: 1951-01-05 , 62  yrs. old GENDER: Male ENDOSCOPIST:Tavio Biegel, MD REFERRED BY:  Dr. Doylene Canard PROCEDURE DATE:  02/15/2013 PROCEDURE:      upper endoscopy ASA CLASS: INDICATIONS:   dark stools, acute on chronic microcytic anemia MEDICATION:    MAC per anesthesia TOPICAL ANESTHETIC:  DESCRIPTION OF PROCEDURE:  the procedure of been discussed with the patient who provided written consent and was brought from his hospital room to the [facility] cone endoscopy unit where MAC anesthesia was provided with complete clinical instability, and time out was performed.  The Pentax video endoscope was passed under direct vision. The vocal cords looked normal. The esophagus was normal in its entirety, without evidence of esophageal varices, Mallory-Weiss tear, esophagitis, Barrett's mucosa, infection, neoplasia, ring, stricture, or hiatal hernia.  The stomach was entered. It contained a small clear bilious residual which was suctioned out. No blood or coffee-ground material were noted.  In the antrum of the stomach was some punctate erythema but no erosive gastritis, ulcers, polyps, or masses were observed anywhere in the stomach, including a retroflex view of the cardia.  The pylorus, and duodenal bulb, and second duodenum looked normal.  The scope was removed from the patient. No biopsies were obtained. The patient tolerated the procedure well and there were no apparent complications.     COMPLICATIONS: None  ENDOSCOPIC IMPRESSION:  1. Minimal antral gastritis, otherwise normal exam 2. No source of microcytic anemia or recent dark stools noted  RECOMMENDATIONS:  1. Await Hemoccult results. Perhaps this patient had a "pseudo" GI bleed with the dark stool as a consequence of his iron therapy. 2. In any event, it is clear that  he has had progressive anemia since December, and the current endoscopic findings do not explain that. Depending on his Hemoccult results, consideration may have to be given to evaluation of the lower GI tract    _______________________________ eSigned:  Ronald Lobo, MD 02/15/2013 3:02 PM    PATIENT NAME:  Shawn Cole, Shawn Cole MR#: [id number]

## 2013-02-15 NOTE — Progress Notes (Signed)
Thank you for consulting the Palliative Medicine Team at [facility] to meet your patient's and family's needs.   The reason that you asked [place] to see your patient is for [place] and options.  We have scheduled your patient for a meeting:  2/13 11am  The Surrogate decision make is: Ivin Booty (sister) Contact information: [phone]  Other family members that need to be present:  Family's discretion    Your patient is able/unable to participate: able  Vinie Sill, NP Palliative Medicine Team Pager # [phone] Team Phone # [phone]

## 2013-02-15 NOTE — Progress Notes (Signed)
PT Cancellation Note  Patient Details Name: Shawn Cole MRN: [id number] DOB: [birth year]   Cancelled Treatment:    Reason Eval/Treat Not Completed: Patient not medically ready. Pt not yet seen by palliative, bil LE DVT not on anticoagulant due to low Hgb and GIB. Await stability and pt decision for care prior to eval. Discussed with pt RN, Jess.   Lanetta Inch Beth 02/15/2013, 9:35 AM Elwyn Reach, [place]

## 2013-02-15 NOTE — Transfer of Care (Signed)
Immediate Anesthesia Transfer of Care Note  Patient: Shawn Cole  Procedure(s) Performed: Procedure(s): ESOPHAGOGASTRODUODENOSCOPY (EGD) (N/A)  Patient Location: Endoscopy Unit  Anesthesia Type:MAC  Level of Consciousness: awake, alert  and oriented  Airway & Oxygen Therapy: Patient Spontanous Breathing and Patient connected to nasal cannula oxygen  Post-op Assessment: Report given to PACU RN and Post -op Vital signs reviewed and stable  Post vital signs: Reviewed and stable  Complications: No apparent anesthesia complications

## 2013-02-15 NOTE — Consult Note (Signed)
Referring Provider: Dr. Doylene Canard Primary Care Physician:  Birdie Riddle, MD Primary Gastroenterologist:  None (unassigned)  Reason for Consultation:  Dark stool and anemia  HPI: Shawn Cole is a 63 y.o. male  admitted to the hospital 2 days ago with lower extremity edema and shortness of breath.   He has metastatic renal cell carcinoma for which he has been receiving treatment at [facility], and one of the agents being used has been associated with GI bleeding.   He was ultimately found to have lower extremity DVTs.   Meanwhile, he has had a drop in hemoglobin from his baseline of 10.2 in early December, 2014, to an admission hemoglobin of 7.8 (MCV 71, RDW elevated suggestive of iron deficiency).   He is on aspirin, but he has also been on omeprazole which should protect against aspirin-induced ulceration, and his BUN was normal and his dark stools, which have been going on for some time, may be related to the use of iron as an outpatient. To my knowledge, he has not had Hemoccult testing.  He has had some queasiness or nausea. No prior history of GI bleeding, ulcer disease, or endoscopic evaluation   Past Medical History  Diagnosis Date  . Hypertension   . Hypothyroidism   . Hyperlipidemia   . Dehydration   . CHF (congestive heart failure)   . Exertional shortness of breath   . Renal cell carcinoma     Past Surgical History  Procedure Laterality Date  . Irrigation and debridement abscess Left 05/04/2012    Procedure: MINOR INCISION AND DRAINAGE OF ABSCESS;  Surgeon: Cammie Sickle., MD;  Location: [place];  Service: Orthopedics;  Laterality: Left;    Prior to Admission medications   Medication Sig Start Date End Date Taking? Authorizing Provider  aspirin 81 MG tablet Take 81 mg by mouth daily.   Yes Historical Provider, MD  atorvastatin (LIPITOR) 10 MG tablet Take 10 mg by mouth daily.   Yes Historical Provider, MD  digoxin (LANOXIN) 0.125 MG tablet  Take 1 tablet (0.125 mg total) by mouth daily. 11/26/12  Yes Birdie Riddle, MD  docusate sodium 100 MG CAPS Take 100 mg by mouth 2 (two) times daily. 11/26/12  Yes Birdie Riddle, MD  feeding supplement, ENSURE COMPLETE, (ENSURE COMPLETE) LIQD Take 237 mLs by mouth daily. 11/26/12  Yes Birdie Riddle, MD  ferrous sulfate 325 (65 FE) MG tablet Take 1 tablet (325 mg total) by mouth 2 (two) times daily with a meal. 11/26/12  Yes Birdie Riddle, MD  furosemide (LASIX) 20 MG tablet Take 20 mg by mouth daily.   Yes Historical Provider, MD  HYDROcodone-acetaminophen (NORCO/VICODIN) 5-325 MG per tablet Take 1 tablet by mouth every 4 (four) hours as needed (pain).   Yes Historical Provider, MD  levothyroxine (SYNTHROID, LEVOTHROID) 25 MCG tablet Take 25 mcg by mouth daily before breakfast.   Yes Historical Provider, MD  metoprolol (LOPRESSOR) 100 MG tablet Take 100 mg by mouth 2 (two) times daily.   Yes Historical Provider, MD  omeprazole (PRILOSEC) 20 MG capsule Take 20 mg by mouth daily.   Yes Historical Provider, MD  ondansetron (ZOFRAN) 4 MG tablet Take 4 mg by mouth every 8 (eight) hours as needed for nausea.   Yes Historical Provider, MD  oxyCODONE-acetaminophen (PERCOCET/ROXICET) 5-325 MG per tablet One or two tablets every 4 to 6 hours as needed for pain 05/04/12  Yes Cammie Sickle., MD  pazopanib (VOTRIENT) 200  MG tablet Take 800 mg by mouth daily. Take on an empty stomach.   Yes Historical Provider, MD  potassium chloride SA (K-DUR,KLOR-CON) 20 MEQ tablet Take 20 mEq by mouth 2 (two) times daily.   Yes Historical Provider, MD  PRESCRIPTION MEDICATION Take 1 tablet by mouth daily. Chemo drug   Yes Historical Provider, MD  prochlorperazine (COMPAZINE) 10 MG tablet Take 10 mg by mouth every 6 (six) hours as needed for nausea.   Yes Historical Provider, MD  simvastatin (ZOCOR) 20 MG tablet Take 20 mg by mouth daily.   Yes Historical Provider, MD    Current Facility-Administered Medications   Medication Dose Route Frequency Provider Last Rate Last Dose  . 0.9 %  sodium chloride infusion  250 mL Intravenous PRN Birdie Riddle, MD      . acetaminophen (TYLENOL) tablet 650 mg  650 mg Oral Q4H PRN Birdie Riddle, MD   650 mg at 02/14/13 2105  . ALPRAZolam Duanne Moron) tablet 0.25 mg  0.25 mg Oral BID PRN Birdie Riddle, MD      . aspirin EC tablet 81 mg  81 mg Oral Daily Birdie Riddle, MD   81 mg at 02/14/13 1110  . atorvastatin (LIPITOR) tablet 10 mg  10 mg Oral Daily Birdie Riddle, MD   10 mg at 02/14/13 1110  . digoxin (LANOXIN) tablet 0.125 mg  0.125 mg Oral Daily Birdie Riddle, MD   0.125 mg at 02/14/13 1111  . docusate sodium (COLACE) capsule 100 mg  100 mg Oral BID Birdie Riddle, MD   100 mg at 02/14/13 1110  . feeding supplement (ENSURE COMPLETE) (ENSURE COMPLETE) liquid 237 mL  237 mL Oral BID BM Baird Lyons, RD   237 mL at 02/14/13 1522  . feeding supplement (ENSURE) (ENSURE) pudding 1 Container  1 Container Oral Q24H Baird Lyons, RD      . ferrous sulfate tablet 325 mg  325 mg Oral BID WC Birdie Riddle, MD   325 mg at 02/15/13 0740  . furosemide (LASIX) injection 20 mg  20 mg Intravenous Once Birdie Riddle, MD      . HYDROcodone-acetaminophen (NORCO/VICODIN) 5-325 MG per tablet 1 tablet  1 tablet Oral Q4H PRN Birdie Riddle, MD      . levothyroxine (SYNTHROID, LEVOTHROID) tablet 25 mcg  25 mcg Oral QAC breakfast Birdie Riddle, MD   25 mcg at 02/15/13 0740  . ondansetron (ZOFRAN) injection 4 mg  4 mg Intravenous Q6H PRN Birdie Riddle, MD   4 mg at 02/15/13 0541  . pantoprazole (PROTONIX) EC tablet 40 mg  40 mg Oral Daily Birdie Riddle, MD   40 mg at 02/14/13 1112  . potassium chloride SA (K-DUR,KLOR-CON) CR tablet 20 mEq  20 mEq Oral BID Birdie Riddle, MD   20 mEq at 02/14/13 2059  . sodium chloride 0.9 % injection 3 mL  3 mL Intravenous Q12H Birdie Riddle, MD   3 mL at 02/14/13 2118  . sodium chloride 0.9 % injection 3 mL  3 mL Intravenous PRN Birdie Riddle, MD         Allergies as of 02/13/2013 - Review Complete 02/13/2013  Allergen Reaction Noted  . Contrast media [iodinated diagnostic agents] Other (See Comments) 05/04/2012    History reviewed. No pertinent family history.  History   Social History  . Marital Status: Single    Spouse Name: N/A    Number of  Children: N/A  . Years of Education: N/A   Occupational History  . Not on file.   Social History Main Topics  . Smoking status: Former Smoker -- 25 years    Quit date: 01/04/1997  . Smokeless tobacco: Never Used  . Alcohol Use: No  . Drug Use: No  . Sexual Activity: No   Other Topics Concern  . Not on file   Social History Narrative  . No narrative on file    Review of Systems: Dark stools and queasiness per history of present illness.  Physical Exam: Vital signs in last 24 hours: Temp:  [97.2 F (36.2 C)-98.3 F (36.8 C)] 97.7 F (36.5 C) (02/12 0510) Pulse Rate:  [89-104] 93 (02/12 0537) Resp:  [16-20] 17 (02/12 0510) BP: (92-110)/(50-71) 102/57 mmHg (02/12 0537) SpO2:  [96 %-100 %] 98 % (02/12 0510) Weight:  [96.525 kg (212 lb 12.8 oz)] 96.525 kg (212 lb 12.8 oz) (02/12 0510) Last BM Date: 02/14/13 General:   Alert,  somewhat chronically ill-appearing, pleasant and cooperative in NAD Head:  Normocephalic and atraumatic. Eyes:  Sclera clear, no icterus.   Conjunctiva pale. Mouth:   No ulcerations or lesions.  Oropharynx pink & moist. Neck:   No masses or thyromegaly. Lungs:  Clear throughout to auscultation.   No wheezes or crackles; a few soft rhonchi are present. No evident respiratory distress. Heart:   Regular rate and rhythm; no murmurs, clicks, rubs,  or gallops. Abdomen:  Soft, nontender, nontympanitic, and nondistended. No masses, hepatosplenomegaly or ventral hernias noted. Normal bowel sounds, without bruits, guarding, or rebound.   Msk:   Symmetrical without gross deformities. Pulses:  Normal pulses noted. Extremities:   Without clubbing or  cyanosis; severe lower extremity edema. Neurologic:  Alert and coherent;  grossly normal neurologically. Skin:  Intact without significant lesions or rashes. Cervical Nodes:  No significant cervical adenopathy. Psych:   Alert and cooperative. Normal mood and affect.  Intake/Output from previous day: 02/11 0701 - 02/12 0700 In: 1140 [P.O.:840; Blood:300] Out: 2401 [Urine:2400; Stool:1] Intake/Output this shift:    Lab Results:  Recent Labs  02/13/13 2048 02/15/13 0352  WBC 7.2  --   HGB 7.8* 9.1*  HCT 27.0* 31.0*  PLT 288  --    BMET  Recent Labs  02/13/13 2048 02/14/13 0136 02/15/13 0706  NA 134* 134* 135*  K 4.1 4.4 4.6  CL 93* 94* 94*  CO2 24 22 26   GLUCOSE 108* 100* 93  BUN 10 9 9   CREATININE 0.88 0.81 0.86  CALCIUM 8.3* 8.3* 8.0*   LFT  Recent Labs  02/13/13 2048  PROT 7.5  ALBUMIN 1.8*  AST 19  ALT 7  ALKPHOS 66  BILITOT 0.3   PT/INR  Recent Labs  02/13/13 2048  LABPROT 13.5  INR 1.05     Studies/Results: Dg Chest 2 View  02/14/2013   CLINICAL DATA:  CHF, history hypertension, hyperlipidemia, renal cell carcinoma  EXAM: CHEST  2 VIEW  COMPARISON:  11/22/2012 chest radiograph, PET-CT 12/14/2012  FINDINGS: Enlargement of cardiac silhouette.  Prominent left central pulmonary artery stable.  New right peritracheal mass/adenopathy approximately 6.6 x 4.5 cm, compressing trachea causing mild left-to-right tracheal deviation.  Bibasilar atelectasis.  In addition, nodular appearing opacities are seen at the lung bases particularly on right, question increased basilar metastatic disease.  Small bibasilar effusions.  No definite acute osseous abnormalities or pneumothorax.  IMPRESSION: New right peritracheal mass/adenopathy 6.6 x 4.5 cm.  Additional bibasilar nodular opacities suspicious for  progressive metastatic disease.  Enlargement of cardiac silhouette.  Bibasilar atelectasis and small pleural effusions.  Consider CT chest for further evaluation.    Electronically Signed   By: Lavonia Dana M.D.   On: 02/14/2013 08:08    Impression: 1. Microcytic anemia, progressive since December 2. Dark stools, possibly related to iron 3. Aspirin exposure, but on PPI 4. Lower extremity DVT  Plan: 1. Endoscopic evaluation later today. The nature, purpose, and risks of endoscopy, versus observation, were discussed with the patient and his nephew and another family member at the bedside. I feel that this is warranted, even if it turns out to be Hemoccult negative, because of the unexplained iron deficiency anemia and worsening of anemia and aspirin exposure. 2. I will attempt to obtain a stool for Hemoccult testing 3. Continue pantoprazole   LOS: 2 days   Irma Delancey V  02/15/2013, 10:19 AM

## 2013-02-16 ENCOUNTER — Encounter (HOSPITAL_COMMUNITY): Payer: Self-pay | Admitting: Gastroenterology

## 2013-02-16 DIAGNOSIS — I82409 Acute embolism and thrombosis of unspecified deep veins of unspecified lower extremity: Secondary | ICD-10-CM

## 2013-02-16 DIAGNOSIS — Z515 Encounter for palliative care: Secondary | ICD-10-CM

## 2013-02-16 DIAGNOSIS — C649 Malignant neoplasm of unspecified kidney, except renal pelvis: Secondary | ICD-10-CM

## 2013-02-16 DIAGNOSIS — C7800 Secondary malignant neoplasm of unspecified lung: Secondary | ICD-10-CM

## 2013-02-16 DIAGNOSIS — Z66 Do not resuscitate: Secondary | ICD-10-CM

## 2013-02-16 LAB — BASIC METABOLIC PANEL
BUN: 11 mg/dL (ref 6–23)
CO2: 25 mEq/L (ref 19–32)
Calcium: 7.9 mg/dL — ABNORMAL LOW (ref 8.4–10.5)
Chloride: 95 mEq/L — ABNORMAL LOW (ref 96–112)
Creatinine, Ser: 0.9 mg/dL (ref 0.50–1.35)
Glucose, Bld: 89 mg/dL (ref 70–99)
Potassium: 5.1 mEq/L (ref 3.7–5.3)
SODIUM: 134 meq/L — AB (ref 137–147)

## 2013-02-16 LAB — CBC
HCT: 28.9 % — ABNORMAL LOW (ref 39.0–52.0)
Hemoglobin: 8.4 g/dL — ABNORMAL LOW (ref 13.0–17.0)
MCH: 21.2 pg — ABNORMAL LOW (ref 26.0–34.0)
MCHC: 29.1 g/dL — ABNORMAL LOW (ref 30.0–36.0)
MCV: 72.8 fL — ABNORMAL LOW (ref 78.0–100.0)
PLATELETS: 283 10*3/uL (ref 150–400)
RBC: 3.97 MIL/uL — ABNORMAL LOW (ref 4.22–5.81)
RDW: 19.2 % — AB (ref 11.5–15.5)
WBC: 7.8 10*3/uL (ref 4.0–10.5)

## 2013-02-16 LAB — OCCULT BLOOD X 1 CARD TO LAB, STOOL: Fecal Occult Bld: NEGATIVE

## 2013-02-16 MED ORDER — LEVOTHYROXINE SODIUM 25 MCG PO TABS
25.0000 ug | ORAL_TABLET | Freq: Every day | ORAL | Status: AC
  Administered 2013-02-16: 25 ug via ORAL
  Filled 2013-02-16: qty 1

## 2013-02-16 MED ORDER — ALPRAZOLAM 0.25 MG PO TABS: 0.2500 mg | ORAL_TABLET | Freq: Two times a day (BID) | ORAL | Status: DC | PRN

## 2013-02-16 MED ORDER — LEVOTHYROXINE SODIUM 50 MCG PO TABS
50.0000 ug | ORAL_TABLET | Freq: Every day | ORAL | Status: DC
  Administered 2013-02-17 – 2013-02-20 (×4): 50 ug via ORAL
  Filled 2013-02-16 (×5): qty 1

## 2013-02-16 MED ORDER — HEPARIN (PORCINE) IN NACL 100-0.45 UNIT/ML-% IJ SOLN
2600.0000 [IU]/h | INTRAMUSCULAR | Status: DC
  Administered 2013-02-16: 1350 [IU]/h via INTRAVENOUS
  Administered 2013-02-17: 2000 [IU]/h via INTRAVENOUS
  Administered 2013-02-17 (×2): 1800 [IU]/h via INTRAVENOUS
  Administered 2013-02-18: 2500 [IU]/h via INTRAVENOUS
  Administered 2013-02-18: 2600 [IU]/h via INTRAVENOUS
  Filled 2013-02-16 (×9): qty 250

## 2013-02-16 MED ORDER — WARFARIN VIDEO
Freq: Once | Status: AC
  Administered 2013-02-16: 22:00:00

## 2013-02-16 MED ORDER — PATIENT'S GUIDE TO USING COUMADIN BOOK
Freq: Once | Status: AC
  Administered 2013-02-16: 22:00:00
  Filled 2013-02-16: qty 1

## 2013-02-16 MED ORDER — WARFARIN SODIUM 5 MG PO TABS
5.0000 mg | ORAL_TABLET | Freq: Every day | ORAL | Status: DC
  Administered 2013-02-16: 5 mg via ORAL
  Filled 2013-02-16 (×2): qty 1

## 2013-02-16 MED ORDER — FUROSEMIDE 10 MG/ML IJ SOLN
40.0000 mg | Freq: Once | INTRAMUSCULAR | Status: AC
  Administered 2013-02-16: 40 mg via INTRAVENOUS

## 2013-02-16 MED ORDER — WARFARIN - PHYSICIAN DOSING INPATIENT
Freq: Every day | Status: DC
  Administered 2013-02-17: 18:00:00

## 2013-02-16 NOTE — Progress Notes (Signed)
Chaplain responded to consult from nurse. Pt was eating lunch and stated that he had wanted to see a chaplain but that now was not a good time. Chaplain will follow up as necessary.

## 2013-02-16 NOTE — Progress Notes (Signed)
ANTICOAGULATION CONSULT NOTE - Initial Consult  Pharmacy Consult for Heparin Indication: DVT  Allergies  Allergen Reactions  . Contrast Media [Iodinated Diagnostic Agents] Other (See Comments)    "couldn"t use hands or walk, sob"    Patient Measurements: Height: 6\' 2"  (188 cm) Weight: 214 lb 15.2 oz (97.5 kg) IBW/kg (Calculated) : 82.2 Heparin Dosing Weight:  87kg  Vital Signs: Temp: 98.4 F (36.9 C) (02/13 0617) Temp src: Oral (02/13 0617) BP: 97/66 mmHg (02/13 1100) Pulse Rate: 97 (02/13 1100)  Labs:  Recent Labs  02/13/13 2048 02/14/13 0136 02/14/13 0725 02/15/13 0352 02/15/13 0706 02/16/13 0504  HGB 7.8*  --   --  9.1*  --  8.4*  HCT 27.0*  --   --  31.0*  --  28.9*  PLT 288  --   --   --   --  283  LABPROT 13.5  --   --   --   --   --   INR 1.05  --   --   --   --   --   CREATININE 0.88 0.81  --   --  0.86 0.90  TROPONINI <0.30 <0.30 <0.30  --   --   --    Estimated Creatinine Clearance: 100.2 ml/min (by C-G formula based on Cr of 0.9).  Medical History: Past Medical History  Diagnosis Date  . Hypertension   . Hypothyroidism   . Hyperlipidemia   . Dehydration   . CHF (congestive heart failure)   . Exertional shortness of breath   . Renal cell carcinoma   . Dysrhythmia     afib   Medications:  Prescriptions prior to admission  Medication Sig Dispense Refill  . aspirin 81 MG tablet Take 81 mg by mouth daily.      Marland Kitchen atorvastatin (LIPITOR) 10 MG tablet Take 10 mg by mouth daily.      . digoxin (LANOXIN) 0.125 MG tablet Take 1 tablet (0.125 mg total) by mouth daily.  30 tablet  1  . docusate sodium 100 MG CAPS Take 100 mg by mouth 2 (two) times daily.  10 capsule  0  . feeding supplement, ENSURE COMPLETE, (ENSURE COMPLETE) LIQD Take 237 mLs by mouth daily.  30 Bottle  1  . ferrous sulfate 325 (65 FE) MG tablet Take 1 tablet (325 mg total) by mouth 2 (two) times daily with a meal.  60 tablet  3  . furosemide (LASIX) 20 MG tablet Take 20 mg by mouth  daily.      Marland Kitchen HYDROcodone-acetaminophen (NORCO/VICODIN) 5-325 MG per tablet Take 1 tablet by mouth every 4 (four) hours as needed (pain).      Marland Kitchen levothyroxine (SYNTHROID, LEVOTHROID) 25 MCG tablet Take 25 mcg by mouth daily before breakfast.      . metoprolol (LOPRESSOR) 100 MG tablet Take 100 mg by mouth 2 (two) times daily.      Marland Kitchen omeprazole (PRILOSEC) 20 MG capsule Take 20 mg by mouth daily.      . ondansetron (ZOFRAN) 4 MG tablet Take 4 mg by mouth every 8 (eight) hours as needed for nausea.      Marland Kitchen oxyCODONE-acetaminophen (PERCOCET/ROXICET) 5-325 MG per tablet One or two tablets every 4 to 6 hours as needed for pain  30 tablet  0  . pazopanib (VOTRIENT) 200 MG tablet Take 800 mg by mouth daily. Take on an empty stomach.      . potassium chloride SA (K-DUR,KLOR-CON) 20 MEQ tablet Take 20 mEq  by mouth 2 (two) times daily.      Marland Kitchen PRESCRIPTION MEDICATION Take 1 tablet by mouth daily. Chemo drug      . prochlorperazine (COMPAZINE) 10 MG tablet Take 10 mg by mouth every 6 (six) hours as needed for nausea.      . simvastatin (ZOCOR) 20 MG tablet Take 20 mg by mouth daily.        Assessment: 63yo male admitted with complaints of leg edema and shortness of breath.  He has a history significant for renal cell cancer with pulmonary metastasis.  He had dopplers done and he has been found to have a DVT.  We have been asked to initiate IV heparin therapy.  He has had some dark stools and a negative hemoccult with stool.  G.I. Has indicated that he is a candidate for anticoagulation despite a somewhat low hemoglobin.  Goal of Therapy:  Heparin level 0.3-0.7 units/ml Monitor platelets by anticoagulation protocol: Yes   Plan:  1.  Will start therapy without a bolus given his anemia and titrate his regimen to keep him within desired goal range. 2.  Will use weight adjusted dosing for him and begin therapy at 1350 units/hr. 3.  Will obtain a heparin level in 8 hours and adjust.  Rober Minion, PharmD.,  MS Clinical Pharmacist Pager:  [phone] Thank you for allowing pharmacy to be part of this patients care team. 02/16/2013,12:19 PM

## 2013-02-16 NOTE — Progress Notes (Signed)
Subjective:  Awaiting IVC filter placement and palliative care meeting. No source of microcytic anemia or recent dark stools noted.  Objective:  Vital Signs in the last 24 hours: Temp:  [97.8 F (36.6 C)-98.7 F (37.1 C)] 98.4 F (36.9 C) (02/13 0617) Pulse Rate:  [87-100] 97 (02/13 0617) Cardiac Rhythm:  [-] Atrial fibrillation (02/12 2216) Resp:  [17-23] 18 (02/13 0617) BP: (94-111)/(50-72) 107/50 mmHg (02/13 0617) SpO2:  [98 %-100 %] 100 % (02/13 0617) Weight:  [97.5 kg (214 lb 15.2 oz)] 97.5 kg (214 lb 15.2 oz) (02/13 0617)  Physical Exam: BP Readings from Last 1 Encounters:  02/16/13 107/50     Wt Readings from Last 1 Encounters:  02/16/13 97.5 kg (214 lb 15.2 oz)    Weight change: 0.975 kg (2 lb 2.4 oz)  HEENT: [place]/AT, Eyes-Brown, PERL, EOMI, Conjunctiva-Pale, Sclera-Non-icteric Neck: No JVD, No bruit, Trachea midline. Lungs:  Clear with rare basal crackles, Bilateral. Cardiac:  Regular rhythm, normal S1 and S2, no S3.  Abdomen:  Soft, non-tender. Extremities:  2 + (up to knees) edema present. No cyanosis. No clubbing. CNS: AxOx3, Cranial nerves grossly intact, moves all 4 extremities. Right handed. Skin: Warm and dry.   Intake/Output from previous day: 02/12 0701 - 02/13 0700 In: 933 [P.O.:480; I.V.:453] Out: 150 [Urine:150]    Lab Results: BMET    Component Value Date/Time   NA 134* 02/16/2013 0504   NA 134* 12/08/2012 1549   K 5.1 02/16/2013 0504   K 4.5 12/08/2012 1549   CL 95* 02/16/2013 0504   CO2 25 02/16/2013 0504   CO2 26 12/08/2012 1549   GLUCOSE 89 02/16/2013 0504   GLUCOSE 99 12/08/2012 1549   BUN 11 02/16/2013 0504   BUN 7.6 12/08/2012 1549   CREATININE 0.90 02/16/2013 0504   CREATININE 0.8 12/08/2012 1549   CALCIUM 7.9* 02/16/2013 0504   CALCIUM 10.5* 12/08/2012 1549   GFRNONAA >90 02/16/2013 0504   GFRAA >90 02/16/2013 0504   CBC    Component Value Date/Time   WBC 7.8 02/16/2013 0504   WBC 5.9 12/08/2012 1549   RBC 3.97* 02/16/2013 0504   RBC 5.06  12/08/2012 1549   HGB 8.4* 02/16/2013 0504   HGB 10.2* 12/08/2012 1549   HCT 28.9* 02/16/2013 0504   HCT 34.1* 12/08/2012 1549   PLT 283 02/16/2013 0504   PLT 313 12/08/2012 1549   MCV 72.8* 02/16/2013 0504   MCV 67.4* 12/08/2012 1549   MCH 21.2* 02/16/2013 0504   MCH 20.2* 12/08/2012 1549   MCHC 29.1* 02/16/2013 0504   MCHC 29.9* 12/08/2012 1549   RDW 19.2* 02/16/2013 0504   RDW 17.2* 12/08/2012 1549   LYMPHSABS 1.5 02/13/2013 2048   LYMPHSABS 1.5 12/08/2012 1549   MONOABS 0.8 02/13/2013 2048   MONOABS 0.5 12/08/2012 1549   EOSABS 0.1 02/13/2013 2048   EOSABS 0.1 12/08/2012 1549   BASOSABS 0.0 02/13/2013 2048   BASOSABS 0.0 12/08/2012 1549   CARDIAC ENZYMES Lab Results  Component Value Date   TROPONINI <0.30 02/14/2013    Scheduled Meds: . aspirin EC  81 mg Oral Daily  . atorvastatin  10 mg Oral Daily  . digoxin  0.125 mg Oral Daily  . docusate sodium  100 mg Oral BID  . feeding supplement (ENSURE COMPLETE)  237 mL Oral BID BM  . feeding supplement (ENSURE)  1 Container Oral Q24H  . ferrous sulfate  325 mg Oral BID WC  . furosemide  40 mg Intravenous Once  . levothyroxine  25 mcg Oral QAC breakfast  . [START ON 02/17/2013] levothyroxine  50 mcg Oral QAC breakfast  . pantoprazole  40 mg Oral Daily  . 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 IVC filter placement. IV lasix one dose as tolerated.   LOS: 3 days    Dixie Dials  MD  02/16/2013, 9:20 AM

## 2013-02-16 NOTE — Progress Notes (Signed)
Patient's stool is Hemoccult negative.  This, along with the patient's negative endoscopy, suggests that his dark stools were probably due to iron rather than GI bleeding, and that his decline in hemoglobin may be due to chronic illness rather than GI bleeding.  His hemoglobin today has declined slightly from yesterday, but this may reflect simply equilibration following his transfusion earlier. Note that his BUN remains very low at 11, which is further evidence against a current upper tract source of bleeding.  In my opinion, the patient is therefore a candidate for consideration of anticoagulation to treat his lower extremity DVT's. Currently, leg pain is actually his chief complaint, and anticoagulation would offer the advantage of both protection against pulmonary embolism, and treatment of his leg pain, which a filter would not do.  I have discussed all this with Dr. Doylene Canard, who is considering starting the patient on heparin and transitioning to Coumadin, assuming he tolerates the heparin.  We have not excluded a lower GI source of his chronic anemia, but even if this patient had a colon cancer, he would probably not be a candidate for surgery. In general, lower tract lesions that cause chronic anemia, such as vascular ectasia or neoplasms, do not put a person at risk for acute, destabilizing bleeding in the event of anticoagulation. Therefore, I do not feel that putting this patient through colonoscopy to exclude such lesions is mandatory.  We will sign off the patient's case at this time, but would be happy to be contacted if we can be of further assistance in his care.  Cleotis Nipper, M.D. [phone]

## 2013-02-16 NOTE — Consult Note (Signed)
Patient [id number] Feggins      DOB: 11/28/1950      FKC:[id number]     Consult Note from the Palliative Medicine Team at [facility] Requested by: Dr. Doylene Canard     PCP: Birdie Riddle, MD Reason for Consultation: Shawn Cole and options.    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 He was admitted with worsening leg edema and shortness of breath. He was found to have bilateral DVTs and has been anemic. He has had an endoscopy with no bleeding source identified and hemoccult negative. Primary team is considering anemia not from GIB at this point and plan trial of anticoagulation for DVT treatment. Shawn Cole and his family are also waiting to see if we think his Pazopanib was the cause of his DVT and if they should continue this treatment regimen. His sister, Regino Bellow, is present at meeting and is updating Shawn Cole oncologist at [facility]. We discussed his disease process and he is understanding that he has a terminal disease but he would like to continue treatment if this is an option. He has already met with hospice in his home and would like to consider this option if treatment for his cancer is no longer an option or if he were to further deteriorate. Shawn Cole does confirm that he would like to be DNR. I also discussed and provided them an Advance Directives packet and encouraged him to select a formal HCPOA. He understands but wishes to think about who his HCPOA should be.   We also discussed his pain which is primarily in his legs. He is not utilizing the Vicodin as much as he can and I educated him to ask for pain medication before the pain gets out of control and that we can adjust if he does not get relief. He says he will ask for pain medication when he needs it. He also says he has had trouble sleeping and I educated the nurse that he may have Xanax to help with sleep and told Shawn Cole  to tell the nurse if he cannot sleep. We will continue to follow and support [name] holistically.    Goals of Care: 1.  Code Status: DNR   2. Scope of Treatment: Continue aggressive treatment (outside of DNR) for now. Limitations may be decided and put in place depending on outcomes.   4. Disposition: To be determined on outcomes.    3. Symptom Management:   1. Anxiety/Agitation: Alprazolam BID prn. Please also use as needed for sleep. 2. Pain: Vicodin prn. Encouraged [name] to utilize if in pain.  3. Bowel Regimen: Colace scheduled.  4. Nausea/Vomiting: Ondansetron prn.  5. Weakness: Continue medical management. PT when medically stable with DVT.   4. Psychosocial: Emotional support provided to Shawn Cole, sister Regino Bellow, and nephew in room.  5. Spiritual: Spiritual care consult as requested by patient.     Patient Documents Completed or Given: Document Given Completed  Advanced Directives Pkt yes   MOST    DNR    Gone from My Sight    Hard Choices      Brief HPI: 63 yo male with metastastic renal cancer.    ROS: + pain, + sleep disturbance    PMH:  Past Medical History  Diagnosis Date  . Hypertension   . Hypothyroidism   . Hyperlipidemia   .  Dehydration   . CHF (congestive heart failure)   . Exertional shortness of breath   . Renal cell carcinoma   . Dysrhythmia     afib     PSH: Past Surgical History  Procedure Laterality Date  . Irrigation and debridement abscess Left 05/04/2012    Procedure: MINOR INCISION AND DRAINAGE OF ABSCESS;  Surgeon: Cammie Sickle., MD;  Location: [facility];  Service: Orthopedics;  Laterality: Left;  . Esophagogastroduodenoscopy N/A 02/15/2013    Procedure: ESOPHAGOGASTRODUODENOSCOPY (EGD);  Surgeon: Cleotis Nipper, MD;  Location: [facility];  Service: Endoscopy;  Laterality: N/A;   I have reviewed the [place] and SH and  If appropriate update it with new information. Allergies   Allergen Reactions  . Contrast Media [Iodinated Diagnostic Agents] Other (See Comments)    "couldn"t use hands or walk, sob"   Scheduled Meds: . aspirin EC  81 mg Oral Daily  . atorvastatin  10 mg Oral Daily  . digoxin  0.125 mg Oral Daily  . docusate sodium  100 mg Oral BID  . feeding supplement (ENSURE COMPLETE)  237 mL Oral BID BM  . feeding supplement (ENSURE)  1 Container Oral Q24H  . ferrous sulfate  325 mg Oral BID WC  . [START ON 02/17/2013] levothyroxine  50 mcg Oral QAC breakfast  . pantoprazole  40 mg Oral Daily  . sodium chloride  3 mL Intravenous Q12H   Continuous Infusions:  PRN Meds:.sodium chloride, acetaminophen, ALPRAZolam, HYDROcodone-acetaminophen, ondansetron (ZOFRAN) IV, sodium chloride    BP 107/50  Pulse 97  Temp(Src) 98.4 F (36.9 C) (Oral)  Resp 18  Ht $R'6\' 2"'Nh$  (1.88 m)  Wt 97.5 kg (214 lb 15.2 oz)  BMI 27.59 kg/m2  SpO2 100%   PPS: 40%   Intake/Output Summary (Last 24 hours) at 02/16/13 1154 Last data filed at 02/16/13 5956  Gross per 24 hour  Intake    933 ml  Output    150 ml  Net    783 ml   LBM: 02/15/13             Physical Exam:  General:  NAD, alert, pleasant, appears tired HEENT:  [place]/AT, no JVD, moist mucous membranes Chest: CTA, decreased bases, symmetric/non-labored breathes CVS: RRR, S1 S2 Abdomen: Soft, NT, distended, +BS Ext: MAE, edema 2-3+ BLE Lt>Rt Neuro: Alert and oriented, pleasant, follows commands  Labs: CBC    Component Value Date/Time   WBC 7.8 02/16/2013 0504   WBC 5.9 12/08/2012 1549   RBC 3.97* 02/16/2013 0504   RBC 5.06 12/08/2012 1549   HGB 8.4* 02/16/2013 0504   HGB 10.2* 12/08/2012 1549   HCT 28.9* 02/16/2013 0504   HCT 34.1* 12/08/2012 1549   PLT 283 02/16/2013 0504   PLT 313 12/08/2012 1549   MCV 72.8* 02/16/2013 0504   MCV 67.4* 12/08/2012 1549   MCH 21.2* 02/16/2013 0504   MCH 20.2* 12/08/2012 1549   MCHC 29.1* 02/16/2013 0504   MCHC 29.9* 12/08/2012 1549   RDW 19.2* 02/16/2013 0504   RDW 17.2* 12/08/2012  1549   LYMPHSABS 1.5 02/13/2013 2048   LYMPHSABS 1.5 12/08/2012 1549   MONOABS 0.8 02/13/2013 2048   MONOABS 0.5 12/08/2012 1549   EOSABS 0.1 02/13/2013 2048   EOSABS 0.1 12/08/2012 1549   BASOSABS 0.0 02/13/2013 2048   BASOSABS 0.0 12/08/2012 1549    BMET    Component Value Date/Time   NA 134* 02/16/2013 0504   NA 134* 12/08/2012 1549   K  5.1 02/16/2013 0504   K 4.5 12/08/2012 1549   CL 95* 02/16/2013 0504   CO2 25 02/16/2013 0504   CO2 26 12/08/2012 1549   GLUCOSE 89 02/16/2013 0504   GLUCOSE 99 12/08/2012 1549   BUN 11 02/16/2013 0504   BUN 7.6 12/08/2012 1549   CREATININE 0.90 02/16/2013 0504   CREATININE 0.8 12/08/2012 1549   CALCIUM 7.9* 02/16/2013 0504   CALCIUM 10.5* 12/08/2012 1549   GFRNONAA >90 02/16/2013 0504   GFRAA >90 02/16/2013 0504    CMP     Component Value Date/Time   NA 134* 02/16/2013 0504   NA 134* 12/08/2012 1549   K 5.1 02/16/2013 0504   K 4.5 12/08/2012 1549   CL 95* 02/16/2013 0504   CO2 25 02/16/2013 0504   CO2 26 12/08/2012 1549   GLUCOSE 89 02/16/2013 0504   GLUCOSE 99 12/08/2012 1549   BUN 11 02/16/2013 0504   BUN 7.6 12/08/2012 1549   CREATININE 0.90 02/16/2013 0504   CREATININE 0.8 12/08/2012 1549   CALCIUM 7.9* 02/16/2013 0504   CALCIUM 10.5* 12/08/2012 1549   PROT 7.5 02/13/2013 2048   PROT 8.5* 12/08/2012 1549   ALBUMIN 1.8* 02/13/2013 2048   ALBUMIN 2.5* 12/08/2012 1549   AST 19 02/13/2013 2048   AST 17 12/08/2012 1549   ALT 7 02/13/2013 2048   ALT 17 12/08/2012 1549   ALKPHOS 66 02/13/2013 2048   ALKPHOS 78 12/08/2012 1549   BILITOT 0.3 02/13/2013 2048   BILITOT 0.68 12/08/2012 1549   GFRNONAA >90 02/16/2013 0504   GFRAA >90 02/16/2013 0504     Time In Time Out Total Time Spent with Patient Total Overall Time  1100 1215 32min 51min    Greater than 50%  of this time was spent counseling and coordinating care related to the above assessment and plan.  Vinie Sill, NP Palliative Medicine Team Pager # [phone] Team Phone # [phone]

## 2013-02-16 NOTE — Progress Notes (Signed)
PT Cancellation Note  Patient Details Name: Shawn Cole MRN: [id number] DOB: March 04, 1950   Cancelled Treatment:    Reason Eval/Treat Not Completed: Patient not medically ready (Pt has BLE DVTs and does not have an IVC filter nor anticoagulation therapy. Will follow. )   Philomena Doheny 02/16/2013, 12:03 PM [phone]

## 2013-02-17 LAB — BASIC METABOLIC PANEL
BUN: 11 mg/dL (ref 6–23)
CO2: 26 mEq/L (ref 19–32)
Calcium: 8.4 mg/dL (ref 8.4–10.5)
Chloride: 93 mEq/L — ABNORMAL LOW (ref 96–112)
Creatinine, Ser: 0.85 mg/dL (ref 0.50–1.35)
Glucose, Bld: 102 mg/dL — ABNORMAL HIGH (ref 70–99)
Potassium: 4.8 mEq/L (ref 3.7–5.3)
SODIUM: 132 meq/L — AB (ref 137–147)

## 2013-02-17 LAB — CBC
HEMATOCRIT: 30.8 % — AB (ref 39.0–52.0)
Hemoglobin: 8.9 g/dL — ABNORMAL LOW (ref 13.0–17.0)
MCH: 21.3 pg — ABNORMAL LOW (ref 26.0–34.0)
MCHC: 28.9 g/dL — AB (ref 30.0–36.0)
MCV: 73.7 fL — ABNORMAL LOW (ref 78.0–100.0)
Platelets: 363 10*3/uL (ref 150–400)
RBC: 4.18 MIL/uL — ABNORMAL LOW (ref 4.22–5.81)
RDW: 19.8 % — AB (ref 11.5–15.5)
WBC: 9 10*3/uL (ref 4.0–10.5)

## 2013-02-17 LAB — HEPARIN LEVEL (UNFRACTIONATED)
Heparin Unfractionated: <0.1 IU/mL — ABNORMAL LOW (ref 0.30–0.70)
Heparin Unfractionated: <0.1 IU/mL — ABNORMAL LOW (ref 0.30–0.70)
Heparin Unfractionated: 0.12 IU/mL — ABNORMAL LOW (ref 0.30–0.70)

## 2013-02-17 LAB — PROTIME-INR
INR: 1.14 (ref 0.00–1.49)
PROTHROMBIN TIME: 14.4 s (ref 11.6–15.2)

## 2013-02-17 MED ORDER — HEPARIN BOLUS VIA INFUSION
3000.0000 [IU] | Freq: Once | INTRAVENOUS | Status: AC
  Administered 2013-02-17: 3000 [IU] via INTRAVENOUS
  Filled 2013-02-17: qty 3000

## 2013-02-17 MED ORDER — WARFARIN SODIUM 7.5 MG PO TABS
7.5000 mg | ORAL_TABLET | Freq: Every day | ORAL | Status: DC
  Administered 2013-02-17: 7.5 mg via ORAL
  Filled 2013-02-17 (×2): qty 1

## 2013-02-17 NOTE — Evaluation (Signed)
Physical Therapy Evaluation Patient Details Name: Shawn Cole MRN: [id number] DOB: 1950/09/28 Today's Date: 02/17/2013 Time: 9528-4132 PT Time Calculation (min): 20 min  PT Assessment / Plan / Recommendation History of Present Illness  63 year old male with left renal call cancer with pulmonary metastasis, hypothyroidism, obesity and iron deficiency anemia has 2 weeks history of progressively worsening leg edema and shortness of breath without fever and cough. Pt found to have bil DVTs. Started on heparin 2/13.  Clinical Impression  Pt adm due to the above. Presents with limitations in functional mobility secondary to deficits indicated below (see PT problem list). Pt to benefit from skilled acute PT to increase mobility and assess need for AD prior to D/C to next venue. Recommending SNF for post acute rehab upon D/C due to fall risk and decreased independence with mobility. Pt agreeable at this time. Pt lives in [place] but family lives in [place], uncertain as to location preference for SNF. Will assess need for RW next visit.     PT Assessment  Patient needs continued PT services    Follow Up Recommendations  SNF;Supervision/Assistance - 24 hour    Does the patient have the potential to tolerate intense rehabilitation      Barriers to Discharge        Equipment Recommendations  Other (comment) (TBD)    Recommendations for Other Services OT consult   Frequency Min 2X/week    Precautions / Restrictions Precautions Precautions: Fall Restrictions Weight Bearing Restrictions: No   Pertinent Vitals/Pain 7/10 in bil LEs. patient repositioned for comfort with bil LEs elevated.      Mobility  Bed Mobility Overal bed mobility: Needs Assistance Bed Mobility: Supine to Sit;Sit to Supine Supine to sit: Modified independent (Device/Increase time);HOB elevated Sit to supine: Min assist General bed mobility comments: incr time, HOB elevated and handrails required for pt to sit  on EOB; pt required (A) to bring legs into bed and supine position due to generalized weakness and edema  Transfers Overall transfer level: Needs assistance Equipment used: Straight cane Transfers: Sit to/from Stand Sit to Stand: Min assist General transfer comment: pt unsteady with transfers; requires (A) to maintain balance and achieve upright position; cues for hand placement and sequencing Ambulation/Gait Ambulation/Gait assistance: Min assist Ambulation Distance (Feet): 18 Feet Assistive device: Straight cane Gait Pattern/deviations: Shuffle;Decreased dorsiflexion - left;Decreased dorsiflexion - right;Wide base of support;Antalgic Gait velocity: very decreased Gait velocity interpretation: <1.8 ft/sec, indicative of risk for recurrent falls General Gait Details: pt very unsteady with gt; had LOB x 2, primarily with directional changes; requires (A) to maintain balance; will attempt RW next session; cues for safety and sequencing    Exercises General Exercises - Lower Extremity Ankle Circles/Pumps: AROM;Both;10 reps;Supine   PT Diagnosis: Abnormality of gait;Generalized weakness;Acute pain  PT Problem List: Decreased strength;Decreased range of motion;Decreased activity tolerance;Decreased balance;Decreased mobility;Impaired sensation;Decreased knowledge of use of DME;Decreased safety awareness;Pain PT Treatment Interventions: DME instruction;Gait training;Functional mobility training;Therapeutic activities;Therapeutic exercise;Balance training;Neuromuscular re-education;Patient/family education     PT Goals(Current goals can be found in the care plan section) Acute Rehab PT Goals Patient Stated Goal: to get stronger before home PT Goal Formulation: With patient Time For Goal Achievement: 03/03/13 Potential to Achieve Goals: Good  Visit Information  Last PT Received On: 02/17/13 Assistance Needed: +1 History of Present Illness: 63 year old male with left renal call cancer with  pulmonary metastasis, hypothyroidism, obesity and iron deficiency anemia has 2 weeks history of progressively worsening leg edema and shortness  of breath without fever and cough. Pt found to have bil DVTs. Started on heparin 2/13.       Prior [place] expects to be discharged to:: Private residence Living Arrangements: Other (Comment) (Nephew ) Available Help at Discharge: Family;Available 24 hours/day Type of Home: Apartment Home Access: Level entry Home Layout: One level Home Equipment: Cane - single point Prior Function Level of Independence: Needs assistance Gait / Transfers Assistance Needed: pt ambulating with SPC  ADL's / Homemaking Assistance Needed: nephew (A) with dressing lower body; family responsible for cooking and housework  Communication Communication: No difficulties Dominant Hand: Right    Cognition  Cognition Arousal/Alertness: Awake/alert Behavior During Therapy: Flat affect Overall Cognitive Status: Within Functional Limits for tasks assessed    Extremity/Trunk Assessment Upper Extremity Assessment Upper Extremity Assessment: Defer to OT evaluation Lower Extremity Assessment Lower Extremity Assessment: Generalized weakness (c/o numbness in bil feet ) Cervical / Trunk Assessment Cervical / Trunk Assessment: Normal   Balance Balance Overall balance assessment: Needs assistance Sitting-balance support: Feet supported;Bilateral upper extremity supported Sitting balance-Leahy Scale: Good Standing balance support: During functional activity;Single extremity supported Standing balance-Leahy Scale: Poor Standing balance comment: unsteady General Comments General comments (skin integrity, edema, etc.): bil edema noted in LEs  End of Session PT - End of Session Equipment Utilized During Treatment: Gait belt Activity Tolerance: Patient tolerated treatment well Patient left: in bed;with call bell/phone within reach;with  family/visitor present Nurse Communication: Mobility status;Precautions  GP     Gustavus Bryant, [place] 02/17/2013, 10:30 AM

## 2013-02-17 NOTE — Progress Notes (Signed)
Subjective:  Feeling little better. Now on heparin and coumadin. Afebrile.  Objective:  Vital Signs in the last 24 hours: Temp:  [98 F (36.7 C)-98.5 F (36.9 C)] 98 F (36.7 C) (02/14 0537) Pulse Rate:  [96-108] 99 (02/14 1123) Cardiac Rhythm:  [-] Atrial fibrillation (02/13 2102) Resp:  [18-20] 18 (02/14 1123) BP: (94-110)/(50-76) 108/50 mmHg (02/14 1123) SpO2:  [97 %-100 %] 100 % (02/14 1123) Weight:  [97.115 kg (214 lb 1.6 oz)] 97.115 kg (214 lb 1.6 oz) (02/14 0537)  Physical Exam: BP Readings from Last 1 Encounters:  02/17/13 108/50     Wt Readings from Last 1 Encounters:  02/17/13 97.115 kg (214 lb 1.6 oz)    Weight change: -0.385 kg (-13.6 oz)  HEENT: [place]/AT, Eyes-Brown, PERL, EOMI, Conjunctiva-Pale, Sclera-Non-icteric Neck: No JVD, No bruit, Trachea midline. Lungs:  Clear, Bilateral. Cardiac:  Regular rhythm, normal S1 and S2, no S3.  Abdomen:  Soft, non-tender. Extremities:  2 + edema present. No cyanosis. No clubbing. CNS: AxOx3, Cranial nerves grossly intact, moves all 4 extremities. Right handed. Skin: Warm and dry.   Intake/Output from previous day: 02/13 0701 - 02/14 0700 In: 634.6 [P.O.:440; I.V.:194.6] Out: 850 [Urine:850]    Lab Results: BMET    Component Value Date/Time   NA 132* 02/17/2013 0722   NA 134* 12/08/2012 1549   K 4.8 02/17/2013 0722   K 4.5 12/08/2012 1549   CL 93* 02/17/2013 0722   CO2 26 02/17/2013 0722   CO2 26 12/08/2012 1549   GLUCOSE 102* 02/17/2013 0722   GLUCOSE 99 12/08/2012 1549   BUN 11 02/17/2013 0722   BUN 7.6 12/08/2012 1549   CREATININE 0.85 02/17/2013 0722   CREATININE 0.8 12/08/2012 1549   CALCIUM 8.4 02/17/2013 0722   CALCIUM 10.5* 12/08/2012 1549   GFRNONAA >90 02/17/2013 0722   GFRAA >90 02/17/2013 0722   CBC    Component Value Date/Time   WBC 9.0 02/17/2013 0722   WBC 5.9 12/08/2012 1549   RBC 4.18* 02/17/2013 0722   RBC 5.06 12/08/2012 1549   HGB 8.9* 02/17/2013 0722   HGB 10.2* 12/08/2012 1549   HCT 30.8* 02/17/2013  0722   HCT 34.1* 12/08/2012 1549   PLT 363 02/17/2013 0722   PLT 313 12/08/2012 1549   MCV 73.7* 02/17/2013 0722   MCV 67.4* 12/08/2012 1549   MCH 21.3* 02/17/2013 0722   MCH 20.2* 12/08/2012 1549   MCHC 28.9* 02/17/2013 0722   MCHC 29.9* 12/08/2012 1549   RDW 19.8* 02/17/2013 0722   RDW 17.2* 12/08/2012 1549   LYMPHSABS 1.5 02/13/2013 2048   LYMPHSABS 1.5 12/08/2012 1549   MONOABS 0.8 02/13/2013 2048   MONOABS 0.5 12/08/2012 1549   EOSABS 0.1 02/13/2013 2048   EOSABS 0.1 12/08/2012 1549   BASOSABS 0.0 02/13/2013 2048   BASOSABS 0.0 12/08/2012 1549   CARDIAC ENZYMES Lab Results  Component Value Date   TROPONINI <0.30 02/14/2013    Scheduled Meds: . aspirin EC  81 mg Oral Daily  . atorvastatin  10 mg Oral Daily  . digoxin  0.125 mg Oral Daily  . docusate sodium  100 mg Oral BID  . feeding supplement (ENSURE COMPLETE)  237 mL Oral BID BM  . feeding supplement (ENSURE)  1 Container Oral Q24H  . ferrous sulfate  325 mg Oral BID WC  . levothyroxine  50 mcg Oral QAC breakfast  . pantoprazole  40 mg Oral Daily  . sodium chloride  3 mL Intravenous Q12H  . warfarin  7.5 mg Oral q1800  . Warfarin - Physician Dosing Inpatient   Does not apply q1800   Continuous Infusions: . heparin 2,000 Units/hr (02/17/13 1121)   PRN Meds:.sodium chloride, acetaminophen, ALPRAZolam, HYDROcodone-acetaminophen, ondansetron (ZOFRAN) IV, sodium chloride  Assessment/Plan: Acute left heart systolic failure  Acute GI bleed  Acute bilateral lower extremity DVT  Left renal cell cancer with pulmonary metastasis  Hypothyroidism  Obesity  Hypoalbuminemia  Anemia, iron deficiency and chronic blood loss H/O atrial fibrillation  Pazopanib adverse effect  Anxiety and Depression  Increase activity as tolerated.   LOS: 4 days    Dixie Dials  MD  02/17/2013, 11:37 AM

## 2013-02-17 NOTE — Progress Notes (Signed)
ANTICOAGULATION CONSULT NOTE - Follow Up Consult  Pharmacy Consult for heparin Indication: DVT  Labs:  Recent Labs  02/14/13 0136 02/14/13 0725 02/15/13 0352 02/15/13 0706 02/16/13 0504 02/16/13 2127  HGB  --   --  9.1*  --  8.4*  --   HCT  --   --  31.0*  --  28.9*  --   PLT  --   --   --   --  283  --   HEPARINUNFRC  --   --   --   --   --  <0.10*  CREATININE 0.81  --   --  0.86 0.90  --   TROPONINI <0.30 <0.30  --   --   --   --     Assessment: 62yo male undetectable on heparin with initial dosing for DVT.  Goal of Therapy:  Heparin level 0.3-0.7 units/ml   Plan:  Will increase heparin gtt by 4 units/kg/hr to 1800 units/hr and check level in 6hr.  Wynona Neat, PharmD, BCPS  02/17/2013,12:17 AM

## 2013-02-17 NOTE — Progress Notes (Addendum)
ANTICOAGULATION CONSULT NOTE - Follow Up Consult  Pharmacy Consult for heparin Indication: DVT  Labs:  Recent Labs  02/15/13 0352 02/15/13 0706 02/16/13 0504 02/16/13 2127 02/17/13 0722  HGB 9.1*  --  8.4*  --  8.9*  HCT 31.0*  --  28.9*  --  30.8*  PLT  --   --  283  --  363  LABPROT  --   --   --   --  14.4  INR  --   --   --   --  1.14  HEPARINUNFRC  --   --   --  <0.10* <0.10*  CREATININE  --  0.86 0.90  --  0.85   Assessment: 62yo male undetectable on heparin with initial dosing for DVT.  Repeat level remains undetectable on IV heparin rate of 1800 units/hr.  I spoke with his nurse who indicates no issues with his IV site, lines and no active bleeding.  His H/H is low, platelets are WNL.  He has a pre-mixed IV heparin bag hanging.  Goal of Therapy:  Heparin level 0.3-0.7 units/ml   Plan:  - Will give a 3000 unit IV bolus and increase his drip to 2000 units/hr ~ 20ut/kg - Will check a heparin level 6 hours after rate change - May require higher Warfarin dose given disease state of cancer  Rober Minion, PharmD., MS Clinical Pharmacist Pager:  [phone] Thank you for allowing pharmacy to be part of this patients care team. 02/17/2013,10:24 AM  Addendum: Heparin level = 0.12 after bolus and rate increase.  I spoke with the nurse for this patient and she reports that Heparin was off so that his IV site could be changed.  This may be why his level is lower than expected.  Plan: Will repeat heparin level in 6 hours after restarting. Rober Minion, PharmD., MS Clinical Pharmacist Pager:  [phone] Thank you for allowing pharmacy to be part of this patients care team.

## 2013-02-18 LAB — TYPE AND SCREEN
ABO/RH(D): AB POS
Antibody Screen: NEGATIVE
Unit division: 0
Unit division: 0
Unit division: 0

## 2013-02-18 LAB — CBC
HEMATOCRIT: 28.8 % — AB (ref 39.0–52.0)
HEMOGLOBIN: 8.4 g/dL — AB (ref 13.0–17.0)
MCH: 21.4 pg — AB (ref 26.0–34.0)
MCHC: 29.2 g/dL — ABNORMAL LOW (ref 30.0–36.0)
MCV: 73.3 fL — AB (ref 78.0–100.0)
Platelets: 322 10*3/uL (ref 150–400)
RBC: 3.93 MIL/uL — AB (ref 4.22–5.81)
RDW: 20 % — ABNORMAL HIGH (ref 11.5–15.5)
WBC: 9.2 10*3/uL (ref 4.0–10.5)

## 2013-02-18 LAB — OCCULT BLOOD X 1 CARD TO LAB, STOOL: Fecal Occult Bld: NEGATIVE

## 2013-02-18 LAB — HEPARIN LEVEL (UNFRACTIONATED)
Heparin Unfractionated: <0.1 IU/mL — ABNORMAL LOW (ref 0.30–0.70)
Heparin Unfractionated: 0.28 IU/mL — ABNORMAL LOW (ref 0.30–0.70)

## 2013-02-18 LAB — PROTIME-INR
INR: 1.19 (ref 0.00–1.49)
Prothrombin Time: 14.8 seconds (ref 11.6–15.2)

## 2013-02-18 MED ORDER — WARFARIN SODIUM 10 MG PO TABS
10.0000 mg | ORAL_TABLET | Freq: Every day | ORAL | Status: DC
  Filled 2013-02-18: qty 1

## 2013-02-18 MED ORDER — HEPARIN BOLUS VIA INFUSION
3000.0000 [IU] | Freq: Once | INTRAVENOUS | Status: AC
  Administered 2013-02-18: 3000 [IU] via INTRAVENOUS
  Filled 2013-02-18: qty 3000

## 2013-02-18 MED ORDER — SODIUM CHLORIDE 0.9 % IV SOLN
INTRAVENOUS | Status: DC
  Administered 2013-02-18: 17:00:00 via INTRAVENOUS

## 2013-02-18 NOTE — Progress Notes (Signed)
Patient had c/o BLE pain. PRN Vicodin administered as ordered. Patient currently resting comfortably, will continue to monitor. Tresa Endo

## 2013-02-18 NOTE — Significant Event (Signed)
Dr Doylene Canard called and informed of large amt frank red blood in urine orders received Heparin and coumadin D/C,  LABS per order. vss no c/o pain noted will continue to monitor patient

## 2013-02-18 NOTE — Progress Notes (Signed)
ANTICOAGULATION CONSULT NOTE - Follow Up Consult  Pharmacy Consult for heparin Indication: DVT  Labs:  Recent Labs  02/15/13 0706 02/16/13 0504  02/17/13 0722 02/17/13 1647 02/18/13 0147  HGB  --  8.4*  --  8.9*  --   --   HCT  --  28.9*  --  30.8*  --   --   PLT  --  283  --  363  --   --   LABPROT  --   --   --  14.4  --  14.8  INR  --   --   --  1.14  --  1.19  HEPARINUNFRC  --   --   < > <0.10* 0.12* <0.10*  CREATININE 0.86 0.90  --  0.85  --   --   < > = values in this interval not displayed.  Assessment: 63yo male remains undetectable on heparin despite rate increases.  Goal of Therapy:  Heparin level 0.3-0.7 units/ml   Plan:  Will increase heparin gtt by 4-5 units/kg/hr to 2500 units/hr and check level in [place], PharmD, BCPS  02/18/2013,4:18 AM

## 2013-02-18 NOTE — Progress Notes (Signed)
Subjective:  Feeling little better. Leg edema slightly improved. Discomfort persist. Afebrile.  Objective:  Vital Signs in the last 24 hours: Temp:  [97.8 F (36.6 C)-99.2 F (37.3 C)] 98 F (36.7 C) (02/15 0550) Pulse Rate:  [65-98] 75 (02/15 1027) Cardiac Rhythm:  [-] Atrial fibrillation (02/15 0856) Resp:  [20] 20 (02/15 0550) BP: (114-136)/(60-75) 128/68 mmHg (02/15 0550) SpO2:  [96 %-98 %] 96 % (02/15 0550) Weight:  [97.8 kg (215 lb 9.8 oz)] 97.8 kg (215 lb 9.8 oz) (02/15 0550)  Physical Exam: BP Readings from Last 1 Encounters:  02/18/13 128/68     Wt Readings from Last 1 Encounters:  02/18/13 97.8 kg (215 lb 9.8 oz)    Weight change: 0.685 kg (1 lb 8.2 oz)  HEENT: [place]/AT, Eyes-Brown, PERL, EOMI, Conjunctiva-Pale, Sclera-Non-icteric Neck: No JVD, No bruit, Trachea midline. Lungs:  Clear, Bilateral. Cardiac:  Regular rhythm, normal S1 and S2, no S3.  Abdomen:  Soft, non-tender. Extremities:  1 + edema present. No cyanosis. No clubbing. CNS: AxOx3, Cranial nerves grossly intact, moves all 4 extremities. Right handed. Skin: Warm and dry.   Intake/Output from previous day: 02/14 0701 - 02/15 0700 In: 1160 [P.O.:1160] Out: 525 [Urine:525]    Lab Results: BMET    Component Value Date/Time   NA 132* 02/17/2013 0722   NA 134* 12/08/2012 1549   K 4.8 02/17/2013 0722   K 4.5 12/08/2012 1549   CL 93* 02/17/2013 0722   CO2 26 02/17/2013 0722   CO2 26 12/08/2012 1549   GLUCOSE 102* 02/17/2013 0722   GLUCOSE 99 12/08/2012 1549   BUN 11 02/17/2013 0722   BUN 7.6 12/08/2012 1549   CREATININE 0.85 02/17/2013 0722   CREATININE 0.8 12/08/2012 1549   CALCIUM 8.4 02/17/2013 0722   CALCIUM 10.5* 12/08/2012 1549   GFRNONAA >90 02/17/2013 0722   GFRAA >90 02/17/2013 0722   CBC    Component Value Date/Time   WBC 9.0 02/17/2013 0722   WBC 5.9 12/08/2012 1549   RBC 4.18* 02/17/2013 0722   RBC 5.06 12/08/2012 1549   HGB 8.9* 02/17/2013 0722   HGB 10.2* 12/08/2012 1549   HCT 30.8* 02/17/2013  0722   HCT 34.1* 12/08/2012 1549   PLT 363 02/17/2013 0722   PLT 313 12/08/2012 1549   MCV 73.7* 02/17/2013 0722   MCV 67.4* 12/08/2012 1549   MCH 21.3* 02/17/2013 0722   MCH 20.2* 12/08/2012 1549   MCHC 28.9* 02/17/2013 0722   MCHC 29.9* 12/08/2012 1549   RDW 19.8* 02/17/2013 0722   RDW 17.2* 12/08/2012 1549   LYMPHSABS 1.5 02/13/2013 2048   LYMPHSABS 1.5 12/08/2012 1549   MONOABS 0.8 02/13/2013 2048   MONOABS 0.5 12/08/2012 1549   EOSABS 0.1 02/13/2013 2048   EOSABS 0.1 12/08/2012 1549   BASOSABS 0.0 02/13/2013 2048   BASOSABS 0.0 12/08/2012 1549   CARDIAC ENZYMES Lab Results  Component Value Date   TROPONINI <0.30 02/14/2013    Scheduled Meds: . aspirin EC  81 mg Oral Daily  . atorvastatin  10 mg Oral Daily  . digoxin  0.125 mg Oral Daily  . docusate sodium  100 mg Oral BID  . feeding supplement (ENSURE COMPLETE)  237 mL Oral BID BM  . feeding supplement (ENSURE)  1 Container Oral Q24H  . ferrous sulfate  325 mg Oral BID WC  . levothyroxine  50 mcg Oral QAC breakfast  . pantoprazole  40 mg Oral Daily  . sodium chloride  3 mL Intravenous Q12H  .  warfarin  10 mg Oral q1800  . Warfarin - Physician Dosing Inpatient   Does not apply q1800   Continuous Infusions: . 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   LOS: 5 days    Dixie Dials  MD  02/18/2013, 12:01 PM

## 2013-02-18 NOTE — Progress Notes (Signed)
ANTICOAGULATION CONSULT NOTE - Follow Up Consult  Pharmacy Consult for heparin Indication: DVT  Assessment: 63yo male on IV heparin for DVT. Repeat level remains below desired goal range (0.28) on IV heparin rate of 2500 units/hr.  He is currently at ~ 25.5 units/kg/hr.  I will bump his rate just a tad higher to try and get his to within desired goal.  His requirements are higher than normal.  His H/H is low, platelets are WNL.  He is also on Warfarin which is being dosed by MD - dose increased to 10 mg daily, not sure why INR was canceled today.  Goal of Therapy:  Heparin level 0.3-0.7 units/ml   Plan:  Will increase heparin gtt to 2600 units/hr. Will check heparin level with AM labs and adjust  Labs:  Recent Labs  02/16/13 0504  02/17/13 0722 02/17/13 1647 02/18/13 0147 02/18/13 1109  HGB 8.4*  --  8.9*  --   --   --   HCT 28.9*  --  30.8*  --   --   --   PLT 283  --  363  --   --   --   LABPROT  --   --  14.4  --  14.8  --   INR  --   --  1.14  --  1.19  --   HEPARINUNFRC  --   < > <0.10* 0.12* <0.10* 0.28*  CREATININE 0.90  --  0.85  --   --   --   < > = values in this interval not displayed.  Rober Minion, PharmD., MS Clinical Pharmacist Pager:  [phone] Thank you for allowing pharmacy to be part of this patients care team. 02/18/2013,12:26 PM

## 2013-02-19 LAB — PROTIME-INR
INR: 1.19 (ref 0.00–1.49)
Prothrombin Time: 14.8 seconds (ref 11.6–15.2)

## 2013-02-19 LAB — BASIC METABOLIC PANEL
BUN: 9 mg/dL (ref 6–23)
CO2: 25 meq/L (ref 19–32)
Calcium: 8.3 mg/dL — ABNORMAL LOW (ref 8.4–10.5)
Chloride: 93 mEq/L — ABNORMAL LOW (ref 96–112)
Creatinine, Ser: 0.83 mg/dL (ref 0.50–1.35)
GFR calc Af Amer: >90 mL/min (ref >90)
GFR calc non Af Amer: >90 mL/min (ref >90)
GLUCOSE: 90 mg/dL (ref 70–99)
POTASSIUM: 4.8 meq/L (ref 3.7–5.3)
SODIUM: 129 meq/L — AB (ref 137–147)

## 2013-02-19 LAB — CBC
HCT: 28.2 % — ABNORMAL LOW (ref 39.0–52.0)
HEMOGLOBIN: 8.2 g/dL — AB (ref 13.0–17.0)
MCH: 21.2 pg — AB (ref 26.0–34.0)
MCHC: 29.1 g/dL — ABNORMAL LOW (ref 30.0–36.0)
MCV: 73.1 fL — ABNORMAL LOW (ref 78.0–100.0)
PLATELETS: 318 10*3/uL (ref 150–400)
RBC: 3.86 MIL/uL — AB (ref 4.22–5.81)
RDW: 20.1 % — ABNORMAL HIGH (ref 11.5–15.5)
WBC: 8.2 10*3/uL (ref 4.0–10.5)

## 2013-02-19 LAB — URINALYSIS, ROUTINE W REFLEX MICROSCOPIC
GLUCOSE, UA: NEGATIVE mg/dL
KETONES UR: 15 mg/dL — AB
NITRITE: POSITIVE — AB
PH: 6 (ref 5.0–8.0)
Protein, ur: 100 mg/dL — AB
SPECIFIC GRAVITY, URINE: 1.019 (ref 1.005–1.030)
Urobilinogen, UA: 4 mg/dL — ABNORMAL HIGH (ref 0.0–1.0)

## 2013-02-19 LAB — URINE MICROSCOPIC-ADD ON

## 2013-02-19 LAB — HEPARIN LEVEL (UNFRACTIONATED)

## 2013-02-19 MED ORDER — CIPROFLOXACIN HCL 250 MG PO TABS
250.0000 mg | ORAL_TABLET | Freq: Two times a day (BID) | ORAL | Status: DC
  Administered 2013-02-19 – 2013-02-20 (×2): 250 mg via ORAL
  Filled 2013-02-19 (×4): qty 1

## 2013-02-19 MED ORDER — PHENAZOPYRIDINE HCL 200 MG PO TABS
200.0000 mg | ORAL_TABLET | Freq: Once | ORAL | Status: AC
  Administered 2013-02-19: 200 mg via ORAL
  Filled 2013-02-19: qty 1

## 2013-02-19 NOTE — Progress Notes (Signed)
Progress Note from the Palliative Medicine Team at [facility]: Shawn Cole is very lethargic today. I spoke with his sister Shawn Cole and she tells me that they hope to go home with [facility]. She also tells me that he has had increased bleeding in his urine and that they had to stop the anticoagulation therapy for his DVTs. They wish to focus on comfort now. I gave them Hard Choices book and also went over the MOST form with them but they are unable to complete at this time. I will follow up tomorrow to see if we can complete MOST. They have also decided no IVC filter under the guidance of their oncologist at [facility] and they understand the risks. They hope to go home and focus on comfort. Shawn Cole says that Shawn Cole is a man with strong faith and that he is at peace and is ready "to go home." The family is just having difficulty letting him go. They are also wondering if he will still be a candidate to continue his Pazopanib. They understand that he may not qualify for hospice if he continues Pazopanib - but may need to look into qualification due to his DVTs and unable to tolerate anticoagulation. Shawn Cole seems increasingly lethargic each day and has a very poor prognosis.    Objective: Allergies  Allergen Reactions  . Contrast Media [Iodinated Diagnostic Agents] Other (See Comments)    "couldn"t use hands or walk, sob"   Scheduled Meds: . atorvastatin  10 mg Oral Daily  . ciprofloxacin  250 mg Oral BID  . digoxin  0.125 mg Oral Daily  . docusate sodium  100 mg Oral BID  . feeding supplement (ENSURE COMPLETE)  237 mL Oral BID BM  . feeding supplement (ENSURE)  1 Container Oral Q24H  . ferrous sulfate  325 mg Oral BID WC  . levothyroxine  50 mcg Oral QAC breakfast  . pantoprazole  40 mg Oral Daily   Continuous Infusions: . sodium chloride 50 mL/hr at 02/18/13 1726   PRN Meds:.acetaminophen, ALPRAZolam, HYDROcodone-acetaminophen, ondansetron (ZOFRAN) IV  BP 106/60  Pulse  95  Temp(Src) 98.9 F (37.2 C) (Oral)  Resp 20  Ht 6\' 2"  (1.88 m)  Wt 98.476 kg (217 lb 1.6 oz)  BMI 27.86 kg/m2  SpO2 99%   PPS: 40%  Pain Score: "not bad" Pain Location: legs   Intake/Output Summary (Last 24 hours) at 02/19/13 1605 Last data filed at 02/19/13 0649  Gross per 24 hour  Intake 1071.67 ml  Output    926 ml  Net 145.67 ml      LBM: 02/18/13   Stool Softner: yes  Physical Exam:  General: NAD, lethargic HEENT: [place]/AT, no JVD, moist mucous membranes Chest: CTA throughout, decreased bases, symmetric, no labored breathing CVS: RRR, S1 S2 Abdomen: Soft, NT, ND, +BS Ext: BLE 3+ edema, pulses 2+ bilat, warm to touch Neuro: Alert, lethargic, follows commands  Labs: CBC    Component Value Date/Time   WBC 8.2 02/19/2013 0625   WBC 5.9 12/08/2012 1549   RBC 3.86* 02/19/2013 0625   RBC 5.06 12/08/2012 1549   HGB 8.2* 02/19/2013 0625   HGB 10.2* 12/08/2012 1549   HCT 28.2* 02/19/2013 0625   HCT 34.1* 12/08/2012 1549   PLT 318 02/19/2013 0625   PLT 313 12/08/2012 1549   MCV 73.1* 02/19/2013 0625   MCV 67.4* 12/08/2012 1549   MCH 21.2* 02/19/2013 0625   MCH 20.2* 12/08/2012 1549   MCHC 29.1*  02/19/2013 0625   MCHC 29.9* 12/08/2012 1549   RDW 20.1* 02/19/2013 0625   RDW 17.2* 12/08/2012 1549   LYMPHSABS 1.5 02/13/2013 2048   LYMPHSABS 1.5 12/08/2012 1549   MONOABS 0.8 02/13/2013 2048   MONOABS 0.5 12/08/2012 1549   EOSABS 0.1 02/13/2013 2048   EOSABS 0.1 12/08/2012 1549   BASOSABS 0.0 02/13/2013 2048   BASOSABS 0.0 12/08/2012 1549    BMET    Component Value Date/Time   NA 129* 02/19/2013 0625   NA 134* 12/08/2012 1549   K 4.8 02/19/2013 0625   K 4.5 12/08/2012 1549   CL 93* 02/19/2013 0625   CO2 25 02/19/2013 0625   CO2 26 12/08/2012 1549   GLUCOSE 90 02/19/2013 0625   GLUCOSE 99 12/08/2012 1549   BUN 9 02/19/2013 0625   BUN 7.6 12/08/2012 1549   CREATININE 0.83 02/19/2013 0625   CREATININE 0.8 12/08/2012 1549   CALCIUM 8.3* 02/19/2013 0625   CALCIUM 10.5* 12/08/2012 1549    GFRNONAA >90 02/19/2013 0625   GFRAA >90 02/19/2013 0625    CMP     Component Value Date/Time   NA 129* 02/19/2013 0625   NA 134* 12/08/2012 1549   K 4.8 02/19/2013 0625   K 4.5 12/08/2012 1549   CL 93* 02/19/2013 0625   CO2 25 02/19/2013 0625   CO2 26 12/08/2012 1549   GLUCOSE 90 02/19/2013 0625   GLUCOSE 99 12/08/2012 1549   BUN 9 02/19/2013 0625   BUN 7.6 12/08/2012 1549   CREATININE 0.83 02/19/2013 0625   CREATININE 0.8 12/08/2012 1549   CALCIUM 8.3* 02/19/2013 0625   CALCIUM 10.5* 12/08/2012 1549   PROT 7.5 02/13/2013 2048   PROT 8.5* 12/08/2012 1549   ALBUMIN 1.8* 02/13/2013 2048   ALBUMIN 2.5* 12/08/2012 1549   AST 19 02/13/2013 2048   AST 17 12/08/2012 1549   ALT 7 02/13/2013 2048   ALT 17 12/08/2012 1549   ALKPHOS 66 02/13/2013 2048   ALKPHOS 78 12/08/2012 1549   BILITOT 0.3 02/13/2013 2048   BILITOT 0.68 12/08/2012 1549   GFRNONAA >90 02/19/2013 0625   GFRAA >90 02/19/2013 0625     Assessment and Plan: 1. Code Status: DNR 2. Symptom Control: 1. Anxiety/Sleep: Xanax prn. 2. Pain: Vicodin prn. 3. Bowel Regimen: Colace scheduled.  4. Nausea/Vomiting: Ondansetron prn. 5. Weakness: Continue medical management. PT following. 3. Psycho/Social: Emotional support provided to patient and family.  4. Spiritual: He is very spiritual and is followed by chaplain services. 5. Disposition: Home with hospice.   Patient Documents Completed or Given: Document Given Completed  Advanced Directives Pkt    MOST yes   DNR    Gone from My Sight    Hard Choices      Time In Time Out Total Time Spent with Patient Total Overall Time  1500 1530 14min 80min    Greater than 50%  of this time was spent counseling and coordinating care related to the above assessment and plan.  Vinie Sill, NP Palliative Medicine Team Pager # [phone] Team Phone # [phone]   1

## 2013-02-19 NOTE — Progress Notes (Signed)
Patient still complaining that he is unable to void and is having some burning.  Bladder scan showed 332ml.  In and out cath performed as per ordered.  367ml of red, bloody urine out after cath.  Will continue to monitor patient.

## 2013-02-19 NOTE — Progress Notes (Signed)
Pt referred to [facility] care.

## 2013-02-19 NOTE — Progress Notes (Signed)
ANTICOAGULATION CONSULT NOTE - Follow Up Consult  Pharmacy Consult for heparin, coumadin Indication: DVT  Assessment: 63yo male on IV heparin for DVT. Heparin turned off overnight due to hematuria.  Coumadin also stopped.  Hgb fairly stable.  AM heparin level undetectable as expected.  Goal of Therapy:  Heparin level 0.3-0.7 units/ml   Plan:  Pharmacy will sign-off, no plans to resume anticoagulation noted.  Labs:  Recent Labs  02/17/13 0722  02/18/13 0147 02/18/13 1109 02/18/13 1650 02/19/13 0625  HGB 8.9*  --   --   --  8.4* 8.2*  HCT 30.8*  --   --   --  28.8* 28.2*  PLT 363  --   --   --  322 318  LABPROT 14.4  --  14.8  --   --  14.8  INR 1.14  --  1.19  --   --  1.19  HEPARINUNFRC <0.10*  < > <0.10* 0.28*  --  <0.10*  CREATININE 0.85  --   --   --   --  0.83  < > = values in this interval not displayed.  Uvaldo Rising, BCPS  Clinical Pharmacist Pager [phone]  02/19/2013 2:26 PM

## 2013-02-19 NOTE — Progress Notes (Signed)
Patient complaining of burning with urination and not being able to empty bladder. Patient is voiding in small amounts with bright red blood.  Bladders scan showed 221ml urine.  Patient also alarmed 5 beat run V-tach.  Patient asymptomatic.  Dr. Doylene Canard made aware.  Will continue to monitor patient.

## 2013-02-19 NOTE — Progress Notes (Signed)
Notified Dr. Doylene Canard of positive urinalysis.  Orders were made will continue to monitor

## 2013-02-19 NOTE — Care Management Note (Signed)
    Page 1 of 1   02/19/2013     2:46:19 PM   CARE MANAGEMENT NOTE 02/19/2013  Patient:  Shawn Cole, Shawn Cole   Account Number:  [id number]  Date Initiated:  02/19/2013  Documentation initiated by:  Fuller Mandril  Subjective/Objective Assessment:   63 year old male with left renal call cancer with pulmonary metastasis, hypothyroidism, obesity and iron deficiency anemia has 2 weeks hx worsening leg edema and shortness of breath without fever and cough.//Home with relatives     Action/Plan:   Blood work including Pro-BNP, Troponin-I and TSH.  EKG/ECHO and CXR.//Home with Hospice   Anticipated DC Date:  02/21/2013   Anticipated DC Plan:  [place]  CM consult      PAC Choice  HOSPICE   Choice offered to / List presented to:  C-1 Patient           Status of service:   Medicare Important Message given?   (If response is "NO", the following Medicare IM given date fields will be blank) Date Medicare IM given:   Date Additional Medicare IM given:    Discharge Disposition:    Per UR Regulation:    If discussed at Long Length of Stay Meetings, dates discussed:    Comments:  02/19/13 [place], RN, BSN, [employer] [phone] Spoke to pt and sister Shawn Cole) at bedside reagarding discharge planning for [place] services.  Shawn Cole states that [facility] and [place] [facility] conducted a home visit have last discharge home to give them overview of services.  Pt and family would like to speak with MD once more before final decision is made.  NCM left list of Home Hospice agencies and will follow up with decision of family.

## 2013-02-19 NOTE — Progress Notes (Signed)
Subjective:  Feeling same. Had hematuria with IV heparin use. Urine partially clearing. Patient and his Oncologist in [place] are against transfer of care to [facility] and IVC filter use. Some dysuria. Stool negative for occult blood. Acute GI bleed unlikely. Tolerating oral feedings.  Objective:  Vital Signs in the last 24 hours: Temp:  [98 F (36.7 C)-98.7 F (37.1 C)] 98 F (36.7 C) (02/16 0412) Pulse Rate:  [82-108] 108 (02/16 1124) Cardiac Rhythm:  [-] Atrial fibrillation (02/15 2000) Resp:  [20] 20 (02/16 0412) BP: (100-119)/(58-73) 101/63 mmHg (02/16 0412) SpO2:  [95 %-99 %] 95 % (02/16 0412) Weight:  [98.476 kg (217 lb 1.6 oz)] 98.476 kg (217 lb 1.6 oz) (02/16 0411)  Physical Exam: BP Readings from Last 1 Encounters:  02/19/13 101/63     Wt Readings from Last 1 Encounters:  02/19/13 98.476 kg (217 lb 1.6 oz)    Weight change: 0.676 kg (1 lb 7.8 oz)  HEENT: [place]/AT, Eyes-Brown, PERL, EOMI, Conjunctiva-Pale, Sclera-Non-icteric Neck: No JVD, No bruit, Trachea midline. Lungs:  Clear, Bilateral. Cardiac:  Regular rhythm, normal S1 and S2, no S3.  Abdomen:  Soft, mild pelvic tenderness. Extremities:  2 + edema present. No cyanosis. No clubbing. CNS: AxOx3, Cranial nerves grossly intact, moves all 4 extremities. Right handed. Skin: Warm and dry.   Intake/Output from previous day: 02/15 0701 - 02/16 0700 In: 1314.7 [P.O.:720; I.V.:594.7] Out: 1126 [Urine:1125; Stool:1]    Lab Results: BMET    Component Value Date/Time   NA 129* 02/19/2013 0625   NA 134* 12/08/2012 1549   K 4.8 02/19/2013 0625   K 4.5 12/08/2012 1549   CL 93* 02/19/2013 0625   CO2 25 02/19/2013 0625   CO2 26 12/08/2012 1549   GLUCOSE 90 02/19/2013 0625   GLUCOSE 99 12/08/2012 1549   BUN 9 02/19/2013 0625   BUN 7.6 12/08/2012 1549   CREATININE 0.83 02/19/2013 0625   CREATININE 0.8 12/08/2012 1549   CALCIUM 8.3* 02/19/2013 0625   CALCIUM 10.5* 12/08/2012 1549   GFRNONAA >90 02/19/2013 0625   GFRAA  >90 02/19/2013 0625   CBC    Component Value Date/Time   WBC 8.2 02/19/2013 0625   WBC 5.9 12/08/2012 1549   RBC 3.86* 02/19/2013 0625   RBC 5.06 12/08/2012 1549   HGB 8.2* 02/19/2013 0625   HGB 10.2* 12/08/2012 1549   HCT 28.2* 02/19/2013 0625   HCT 34.1* 12/08/2012 1549   PLT 318 02/19/2013 0625   PLT 313 12/08/2012 1549   MCV 73.1* 02/19/2013 0625   MCV 67.4* 12/08/2012 1549   MCH 21.2* 02/19/2013 0625   MCH 20.2* 12/08/2012 1549   MCHC 29.1* 02/19/2013 0625   MCHC 29.9* 12/08/2012 1549   RDW 20.1* 02/19/2013 0625   RDW 17.2* 12/08/2012 1549   LYMPHSABS 1.5 02/13/2013 2048   LYMPHSABS 1.5 12/08/2012 1549   MONOABS 0.8 02/13/2013 2048   MONOABS 0.5 12/08/2012 1549   EOSABS 0.1 02/13/2013 2048   EOSABS 0.1 12/08/2012 1549   BASOSABS 0.0 02/13/2013 2048   BASOSABS 0.0 12/08/2012 1549   CARDIAC ENZYMES Lab Results  Component Value Date   TROPONINI <0.30 02/14/2013    Scheduled Meds: . aspirin EC  81 mg Oral Daily  . atorvastatin  10 mg Oral Daily  . digoxin  0.125 mg Oral Daily  . docusate sodium  100 mg Oral BID  . feeding supplement (ENSURE COMPLETE)  237 mL Oral BID BM  . feeding supplement (ENSURE)  1 Container Oral Q24H  .  ferrous sulfate  325 mg Oral BID WC  . levothyroxine  50 mcg Oral QAC breakfast  . pantoprazole  40 mg Oral Daily  . Warfarin - Physician Dosing Inpatient   Does not apply q1800   Continuous Infusions: . 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  Heparin and coumadin discontinued. Foley cath as needed. UA to r/o  UTI Continue Hospice care at home.   LOS: 6 days    Dixie Dials  MD  02/19/2013, 12:37 PM

## 2013-02-20 LAB — URINE CULTURE
COLONY COUNT: NO GROWTH
CULTURE: NO GROWTH

## 2013-02-20 MED ORDER — METOPROLOL TARTRATE 100 MG PO TABS: 50.0000 mg | ORAL_TABLET | Freq: Two times a day (BID) | ORAL | Status: AC

## 2013-02-20 MED ORDER — DIGOXIN 125 MCG PO TABS: 0.1250 mg | ORAL_TABLET | ORAL | Status: AC

## 2013-02-20 MED ORDER — LEVOTHYROXINE SODIUM 50 MCG PO TABS: 25.0000 ug | ORAL_TABLET | Freq: Every day | ORAL | Status: AC

## 2013-02-20 MED ORDER — POTASSIUM CHLORIDE CRYS ER 20 MEQ PO TBCR: 10.0000 meq | EXTENDED_RELEASE_TABLET | Freq: Two times a day (BID) | ORAL | Status: AC

## 2013-02-20 NOTE — Progress Notes (Signed)
Pt being dc to home with hospice, pt and sister given dc instructions, follow up appointments and medications reviewed, pt and pts sister verbalized understanding, pt leaving with family via wheelchair, pt stable

## 2013-02-20 NOTE — Progress Notes (Signed)
Discontinued pt.foley cath. Attempted foley catheter insertion with NT assisting. Foley catheter inserted well without resistance however bloody clots was at the tip of foley catheter which was was preventing flash of urine return. Attempted again twice more with same results. Dr.Kadakia called and notified. Was told that it will be addressed in the am.

## 2013-02-20 NOTE — Discharge Summary (Signed)
Physician Discharge Summary  Patient ID: Shawn Cole MRN: [id number] DOB/AGE: 07-07-1950 63 y.o.  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 Palliative care encounter DNR (do not resuscitate)   Discharged Condition: fair  Hospital Course: 63 year old male with left renal call cancer with pulmonary metastasis, hypothyroidism, obesity and iron deficiency anemia has 2 weeks history of progressively worsening leg edema and shortness of breath without fever and cough. His lower extremity doppler showed bilateral DVT. He was anemic on arrival and had dark stool. He received 1 unit of blood after lasix use. He had GI evaluation by Dr. Youlanda Mighty Buccini. There was no acute GI blood loss.   His Oncologist, BITTING, Darleen Crocker, MD, was called. She, patient and family(Sisters) felt IVC filter will make leg edema more worse. IV heparin and coumadin were given for 2-3 days only and this resulted in significant hematuria. At this point patient and family accepted [facility] for Home Hospice care.   Consults: GI  Significant Diagnostic Studies: labs: Pro BNP-3764.0. TSH-5.4. Low albumin of 1.4. Normal Troponin I x 3. Low Hgb of 7.8. Fecal occult blood negative x 2.  EKG-atrial fibrillation  with controlled ventricular response and non-specific ST-T changes.  Chest x-ray: New right peritracheal mass/adenopathy 6.6 x 4.5 cm.  Additional bibasilar nodular opacities suspicious for progressive metastatic disease.  Enlargement of cardiac silhouette.  Bibasilar atelectasis and small pleural effusions.   Bilateral lower extremity venous duplex evaluation. : Findings consistent with acute deep vein thrombosis involving the right in the mid to distal femoral vein. - Findings consistent with acute deep vein thrombosis involving the left in the common femoral, profunda, femoral, popliteal, posterior tibial, and peroneal veins. Superficial thrombus noted in the left greater saphenous vein. Enlarged inguinal lymph nodes arenoted bilaterally. - No evidence of Baker's cyst on the right or left.  Echocardiogram: Left ventricle: The cavity size was normal. There was mild concentric hypertrophy. Systolic function was normal. The estimated ejection fraction was in the range of 50% to 55%. Possible mild hypokinesis of the basal-midinferoseptal myocardium. - Left atrium: The atrium was mildly dilated. - Right atrium: The atrium was mildly dilated.   Treatments: cardiac meds: metoprolol, digoxin and furosemide and anticoagulation: ASA, heparin and warfarin. Code Status: DNR  Symptom Control:  1. Anxiety/Sleep: Xanax prn. 2. Pain: Vicodin prn. 3. Bowel Regimen: Colace scheduled.  4. Nausea/Vomiting: Ondansetron prn. 5. Weakness: Continue medical management. PT following. Psycho/Social: Emotional support provided to patient and family.  Spiritual: He is very spiritual and is followed by chaplain services.  Disposition: Home with hospice.    Discharge Exam: Blood pressure 104/54, pulse 95, temperature 98 F (36.7 C), temperature source Oral, resp. rate 20, height 6\' 2"  (1.88 m), weight 98.839 kg (217 lb 14.4 oz), SpO2 97.00%.  HEENT: [place]/AT, Eyes-Brown, PERL, EOMI, Conjunctiva-Pale,  Sclera-Non-icteric  Neck: No JVD,  No bruit, Trachea midline.  Lungs: Clear, Bilateral.  Cardiac: Regular rhythm, normal S1 and S2, no S3.  Abdomen: Soft, mild pelvic tenderness.  Extremities: 2 + edema present. No cyanosis. No clubbing.  CNS: AxOx3, Cranial nerves grossly intact, moves all 4 extremities. Right handed.  Skin: Warm and dry.  Disposition: 01-Home or Self Care, Hospice care.     Medication List    STOP taking these medications       aspirin 81 MG tablet     atorvastatin 10 MG tablet  Commonly known as:  LIPITOR     PRESCRIPTION MEDICATION     VOTRIENT 200 MG tablet  Generic drug:  pazopanib      TAKE these medications       digoxin 0.125 MG tablet  Commonly known as:  LANOXIN  Take 1 tablet (0.125 mg total) by mouth every Monday, Wednesday, and Friday.     DSS 100 MG Caps  Take 100 mg by mouth 2 (two) times daily.     feeding supplement (ENSURE COMPLETE) Liqd  Take 237 mLs by mouth daily.     ferrous sulfate 325 (65 FE) MG tablet  Take 1 tablet (325 mg total) by mouth 2 (two) times daily with a meal.     furosemide 20 MG tablet  Commonly known as:  LASIX  Take 20 mg by mouth daily.     HYDROcodone-acetaminophen 5-325 MG per tablet  Commonly known as:  NORCO/VICODIN  Take 1 tablet by mouth every 4 (four) hours as needed (pain).     levothyroxine 50 MCG tablet  Commonly known as:  SYNTHROID, LEVOTHROID  Take 0.5 tablets (25 mcg total) by mouth daily before breakfast.     metoprolol 100 MG tablet  Commonly known as:  LOPRESSOR  Take 0.5 tablets (50 mg total) by mouth 2 (two) times daily.     omeprazole 20 MG capsule  Commonly known as:  PRILOSEC  Take 20 mg by mouth daily.     ondansetron 4 MG tablet  Commonly known as:  ZOFRAN  Take 4 mg by mouth every 8 (eight) hours as needed for nausea.     oxyCODONE-acetaminophen 5-325 MG per tablet  Commonly known as:  PERCOCET/ROXICET  One or two tablets every 4 to 6 hours as needed for pain      potassium chloride SA 20 MEQ tablet  Commonly known as:  K-DUR,KLOR-CON  Take 0.5 tablets (10 mEq total) by mouth 2 (two) times daily.     prochlorperazine 10 MG tablet  Commonly known as:  COMPAZINE  Take 10 mg by mouth every 6 (six) hours as needed for nausea.     simvastatin 20 MG tablet  Commonly known as:  ZOCOR  Take 20 mg by mouth daily.           Follow-up Information   Follow up with [facility], MD In 2 weeks.   Specialty:  Cardiology   Contact information:   [place] [address] [phone]       Follow up with Lawerance Sabal, MD. (As needed)    Specialty:  Hematology and Oncology   Contact information:   [place] [address] [phone]       Signed: Birdie Riddle 02/20/2013, 11:41 AM

## 2013-02-20 NOTE — Progress Notes (Signed)
Around 0200 Called to patient room. Patient and his sister had c/o bloody leakage around the patient's foley site. Pt.had foley inserted during the day on 2/16 for urinary rentention. MD aware that patient's urine has been bloody. Looked at patient's foley site and seen some bloody leakage. Looked at foley tubing and noticed that the tube appeared to no longer drain urine. It looked as though the bloody drainage was thick and was blocking urinary flow. Dr.Kadakia was called and notified. Was told to attempt to place another foley in, and if it becomes blocked again to irrigate the foley if needed. Explained what physician said with patient and family and they are aware.

## 2013-02-20 NOTE — Progress Notes (Signed)
Physical Therapy Treatment Patient Details Name: Shawn Cole MRN: [id number] DOB: 02-09-1950 Today's Date: 02/20/2013 Time: 0254-2706 PT Time Calculation (min): 27 min  PT Assessment / Plan / Recommendation  History of Present Illness 63 year old male with left renal call cancer with pulmonary metastasis, hypothyroidism, obesity and iron deficiency anemia has 2 weeks history of progressively worsening leg edema and shortness of breath without fever and cough. Pt found to have bil DVTs. Started on heparin 2/13.   PT Comments   Pt admitted with above. Pt currently with functional limitations due to balance and endurance deficits. Pt will benefit from skilled PT to increase their independence and safety with mobility to allow discharge to the venue listed below.   Follow Up Recommendations  SNF;Supervision/Assistance - 24 hour     Does the patient have the potential to tolerate intense rehabilitation     Barriers to Discharge        Equipment Recommendations  Rolling walker with 5" wheels    Recommendations for Other Services OT consult  Frequency Min 2X/week   Progress towards PT Goals Progress towards PT goals: Progressing toward goals  Plan Current plan remains appropriate    Precautions / Restrictions Precautions Precautions: Fall Restrictions Weight Bearing Restrictions: No   Pertinent Vitals/Pain VSS, no pain    Mobility  Bed Mobility Overal bed mobility: Needs Assistance Bed Mobility: Supine to Sit;Sit to Supine Supine to sit: Min guard General bed mobility comments: incr time, HOB elevated and handrails required for pt to sit on EOB Transfers Overall transfer level: Needs assistance Equipment used: Rolling walker (2 wheeled) Transfers: Sit to/from Stand Sit to Stand: Min assist General transfer comment: pt unsteady with transfers; requires (A) to maintain balance and achieve upright position; cues for hand placement and sequencing Ambulation/Gait Ambulation/Gait  assistance: Min guard Ambulation Distance (Feet): 75 Feet Assistive device: Rolling walker (2 wheeled) Gait Pattern/deviations: Decreased dorsiflexion - right;Decreased dorsiflexion - left;Antalgic;Wide base of support Gait velocity: very decreased Gait velocity interpretation: <1.8 ft/sec, indicative of risk for recurrent falls General Gait Details: Pt needss to use RW with ambulation for safety.  Cues for safety and sequencing.    Exercises General Exercises - Lower Extremity Ankle Circles/Pumps: AROM;Both;10 reps;Supine Long Arc Quad: AROM;Both;10 reps;Seated Hip Flexion/Marching: AROM;Both;10 reps;Seated   PT Diagnosis:    PT Problem List:   PT Treatment Interventions:     PT Goals (current goals can now be found in the care plan section)    Visit Information  Last PT Received On: 02/20/13 Assistance Needed: +1 History of Present Illness: 63 year old male with left renal call cancer with pulmonary metastasis, hypothyroidism, obesity and iron deficiency anemia has 2 weeks history of progressively worsening leg edema and shortness of breath without fever and cough. Pt found to have bil DVTs. Started on heparin 2/13.    Subjective Data  Subjective: "I want to try but my legs hurt."   Cognition  Cognition Arousal/Alertness: Awake/alert Behavior During Therapy: Flat affect Overall Cognitive Status: Within Functional Limits for tasks assessed    Balance  Balance Overall balance assessment: Needs assistance;History of Falls Standing balance support: Bilateral upper extremity supported;During functional activity Standing balance-Leahy Scale: Fair Standing balance comment: Stands wtih Rw statically with min guard asssit. General Comments General comments (skin integrity, edema, etc.): Bil LEs with edema  End of Session PT - End of Session Equipment Utilized During Treatment: Gait belt Activity Tolerance: Patient tolerated treatment well Patient left: in chair;with call  bell/phone within reach;with  family/visitor present Nurse Communication: Mobility status;Precautions   GP     INGOLD,Gloriana Piltz 02/20/2013, 1:44 PM [facility] [phone] (pager)

## 2013-03-20 NOTE — Consult Note (Signed)
I have reviewed and discussed the care of this patient in detail with the nurse practitioner including pertinent patient records, physical exam findings and data. I agree with details of this encounter.  

## 2013-04-04 DEATH — deceased

## 2014-07-10 IMAGING — CR DG CHEST 2V
1 series · 1 of 1 positions shown · non-contrast
Comparison: 11/22/2012 chest radiograph, PET-CT 12/14/2012

CLINICAL DATA: CHF, history hypertension, hyperlipidemia, renal
cell carcinoma

EXAM:
CHEST  2 VIEW

[w chest lat]
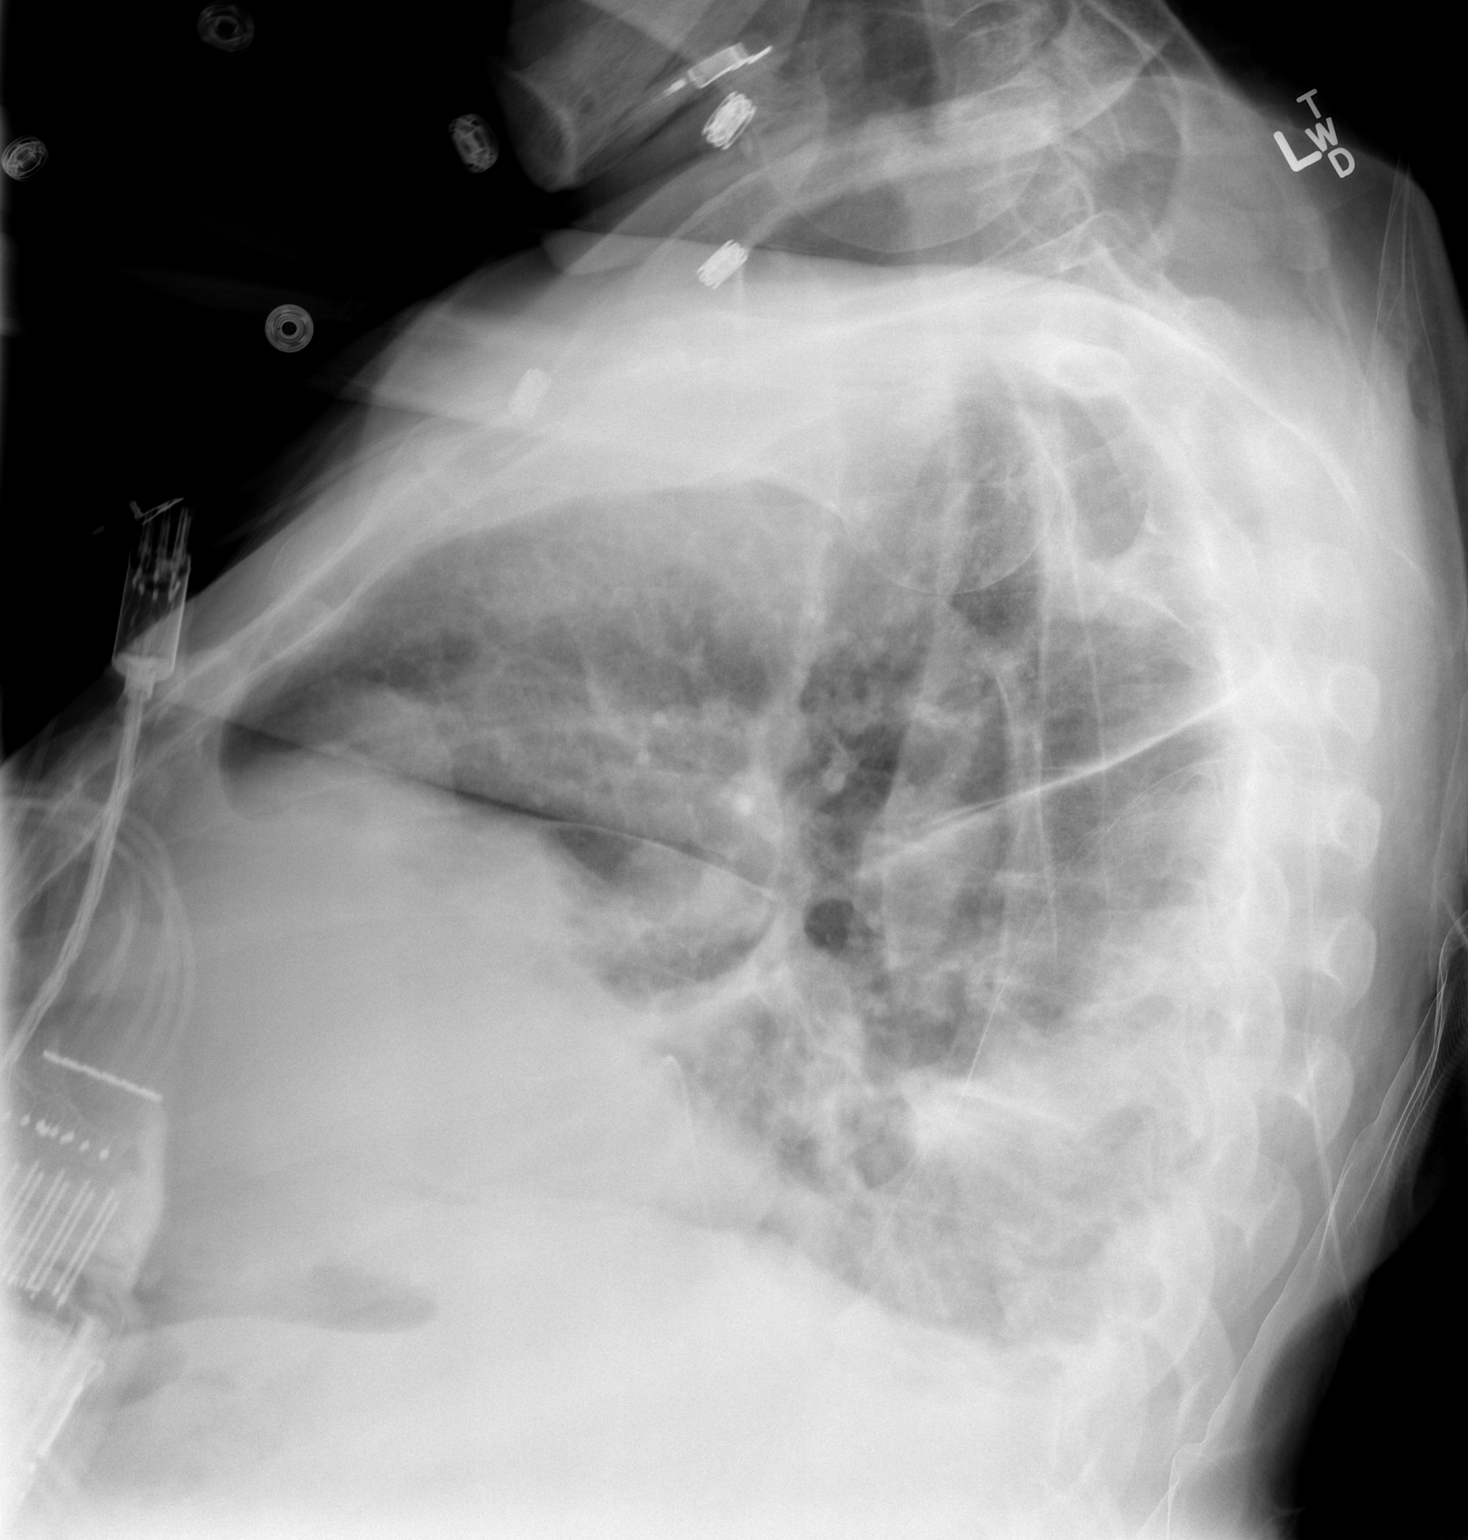

[1 of 1 positions shown; findings below may reference images not displayed]

FINDINGS: Enlargement of cardiac silhouette.

Prominent left central pulmonary artery stable.

New right peritracheal mass/adenopathy approximately 6.6 x 4.5 cm,
compressing trachea causing mild left-to-right tracheal deviation.

Bibasilar atelectasis.

In addition, nodular appearing opacities are seen at the lung bases
particularly on right, question increased basilar metastatic
disease.

Small bibasilar effusions.

No definite acute osseous abnormalities or pneumothorax.
IMPRESSION: New right peritracheal mass/adenopathy 6.6 x 4.5 cm.

Additional bibasilar nodular opacities suspicious for progressive
metastatic disease.

Enlargement of cardiac silhouette.

Bibasilar atelectasis and small pleural effusions.

Consider CT chest for further evaluation.
